# Patient Record
Sex: Female | Born: 1960 | Race: White | Hispanic: No | Marital: Single | Smoking: Former smoker
Health system: Southern US, Community
[De-identification: ages and names within clinical notes are randomized; demographics above are authoritative.]

## PROBLEM LIST (undated history)

## (undated) DIAGNOSIS — M722 Plantar fascial fibromatosis: Secondary | ICD-10-CM

## (undated) DIAGNOSIS — A64 Unspecified sexually transmitted disease: Secondary | ICD-10-CM

## (undated) DIAGNOSIS — I341 Nonrheumatic mitral (valve) prolapse: Secondary | ICD-10-CM

## (undated) HISTORY — DX: Nonrheumatic mitral (valve) prolapse: I34.1

## (undated) HISTORY — DX: Plantar fascial fibromatosis: M72.2

## (undated) HISTORY — DX: Unspecified sexually transmitted disease: A64

## (undated) HISTORY — PX: TONSILLECTOMY: SHX5217

---

## 1999-02-28 ENCOUNTER — Other Ambulatory Visit: Admission: RE | Admit: 1999-02-28 | Discharge: 1999-02-28 | Payer: Self-pay | Admitting: *Deleted

## 2002-07-07 HISTORY — PX: BREAST ENHANCEMENT SURGERY: SHX7

## 2002-11-09 ENCOUNTER — Other Ambulatory Visit: Admission: RE | Admit: 2002-11-09 | Discharge: 2002-11-09 | Payer: Self-pay | Admitting: Obstetrics and Gynecology

## 2004-03-06 ENCOUNTER — Other Ambulatory Visit: Admission: RE | Admit: 2004-03-06 | Discharge: 2004-03-06 | Payer: Self-pay | Admitting: Obstetrics and Gynecology

## 2005-10-13 ENCOUNTER — Other Ambulatory Visit: Admission: RE | Admit: 2005-10-13 | Discharge: 2005-10-13 | Payer: Self-pay | Admitting: Obstetrics and Gynecology

## 2006-04-30 ENCOUNTER — Ambulatory Visit (HOSPITAL_COMMUNITY): Admission: RE | Admit: 2006-04-30 | Discharge: 2006-04-30 | Payer: Self-pay | Admitting: Obstetrics & Gynecology

## 2006-05-05 ENCOUNTER — Other Ambulatory Visit: Admission: RE | Admit: 2006-05-05 | Discharge: 2006-05-05 | Payer: Self-pay | Admitting: Obstetrics & Gynecology

## 2006-10-22 ENCOUNTER — Other Ambulatory Visit: Admission: RE | Admit: 2006-10-22 | Discharge: 2006-10-22 | Payer: Self-pay | Admitting: Obstetrics & Gynecology

## 2007-12-22 ENCOUNTER — Other Ambulatory Visit: Admission: RE | Admit: 2007-12-22 | Discharge: 2007-12-22 | Payer: Self-pay | Admitting: Obstetrics and Gynecology

## 2010-04-06 HISTORY — PX: COLONOSCOPY: SHX174

## 2013-08-06 ENCOUNTER — Encounter: Payer: Self-pay | Admitting: Cardiology

## 2013-09-12 ENCOUNTER — Encounter: Payer: Self-pay | Admitting: Nurse Practitioner

## 2013-09-12 ENCOUNTER — Ambulatory Visit (INDEPENDENT_AMBULATORY_CARE_PROVIDER_SITE_OTHER): Payer: 59 | Admitting: Nurse Practitioner

## 2013-09-12 VITALS — BP 110/64 | HR 68 | Ht 66.0 in | Wt 137.0 lb

## 2013-09-12 DIAGNOSIS — Z9882 Breast implant status: Secondary | ICD-10-CM

## 2013-09-12 DIAGNOSIS — Z978 Presence of other specified devices: Secondary | ICD-10-CM

## 2013-09-12 DIAGNOSIS — Z01419 Encounter for gynecological examination (general) (routine) without abnormal findings: Secondary | ICD-10-CM

## 2013-09-12 DIAGNOSIS — A6004 Herpesviral vulvovaginitis: Secondary | ICD-10-CM | POA: Insufficient documentation

## 2013-09-12 MED ORDER — PENCICLOVIR 1 % EX CREA: 1.0000 "application " | TOPICAL_CREAM | CUTANEOUS | Status: DC

## 2013-09-12 MED ORDER — VALACYCLOVIR HCL 500 MG PO TABS: ORAL_TABLET | ORAL | Status: DC

## 2013-09-12 NOTE — Patient Instructions (Signed)

## 2013-09-12 NOTE — Progress Notes (Signed)
Patient ID: Kim Stone, female   DOB: 02-Jul-1961, 53 y.o.   MRN: [id number] 53 y.o. G1P1001 Single Caucasian Fe here for annual exam.  Not SA for 2 years.  No new health concerns. She went on a cruise with ex husband and daughter.  Have remained friends through the years.  No LMP recorded. Patient is postmenopausal.          Sexually active: no  The current method of family planning is none.    Exercising: yes  Gym/ health club routine includes all exercises daily. Smoker:  no  Health Maintenance: Pap:  08/12/11, WNL, neg HR HPV MMG:  11/23/12, Bi-Rads 1: negative Colonoscopy:  04/2010, normal, repeat in 10 years BMD:   2004 normal TDaP:  2009  Labs: PCP   reports that she quit smoking about 18 years ago. She has never used smokeless tobacco. She reports that she drinks alcohol. She reports that she does not use illicit drugs.  Past Medical History  Diagnosis Date  . MVP (mitral valve prolapse)   . Plantar fasciitis   . 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 Tonsillectomy    . Breast enhancement surgery Bilateral 2004  . Colonoscopy  04/2010    normal recheck in 10 years    Current Outpatient Prescriptions  Medication Sig Dispense Refill  . Calcium 1500 MG tablet Take 1,500 mg by mouth daily.      . metoprolol (LOPRESSOR) 50 MG tablet Take 50 mg by mouth as needed.      . penciclovir (DENAVIR) 1 % cream Apply 1 application topically every 2 (two) hours.  1.5 g  0  . valACYclovir (VALTREX) 500 MG tablet 1 tablet as needed twice daily for flare  30 tablet  12   No current facility-administered medications for this visit.    Family History  Problem Relation Age of Onset  . Heart attack Mother   . Diabetes Mother   . Hyperlipidemia Mother   . Macular degeneration Father   . Heart attack Maternal Grandfather     ROS:  Pertinent items are noted in HPI.  Otherwise, a comprehensive ROS was negative.  Exam:    BP 110/64  Pulse 68  Ht 5\' 6"  (1.676 m)  Wt 137 lb (62.143 kg)  BMI 22.12 kg/m2 Height: 5\' 6"  (167.6 cm)  Ht Readings from Last 3 Encounters:  09/12/13 5\' 6"  (1.676 m)    General appearance: alert, cooperative and appears stated age Head: Normocephalic, without obvious abnormality, atraumatic Neck: no adenopathy, supple, symmetrical, trachea midline and thyroid normal to inspection and palpation Lungs: clear to auscultation bilaterally Breasts: normal appearance, no masses or tenderness Heart: regular rate and rhythm Abdomen: soft, non-tender; no masses,  no organomegaly Extremities: extremities normal, atraumatic, no cyanosis or edema Skin: Skin color, texture, turgor normal. No rashes or lesions Lymph nodes: Cervical, supraclavicular, and axillary nodes normal. No abnormal inguinal nodes palpated Neurologic: Grossly normal   Pelvic: External genitalia:  no lesions              Urethra:  normal appearing urethra with no masses, tenderness or lesions              Bartholin's and Skene's: normal                 Vagina: normal appearing vagina with normal color and discharge, no lesions  Cervix: anteverted              Pap taken: no Bimanual Exam:  Uterus:  normal size, contour, position, consistency, mobility, non-tender              Adnexa: no mass, fullness, tenderness               Rectovaginal: Confirms               Anus:  normal sphincter tone, no lesions  A:  Well Woman with normal exam  Postmenopausal  History of HSV I/ II  P:   Pap smear as per guidelines   Mammogram due 11/2013  Refill on Valtrex for a year and Denavir for oral HSV prn  Counseled on breast self exam, mammography screening, adequate intake of calcium and vitamin D, diet and exercise return annually or prn  An After Visit Summary was printed and given to the patient.

## 2013-09-13 NOTE — Progress Notes (Signed)
Encounter reviewed by Dr. Brook Silva.  

## 2014-05-08 ENCOUNTER — Encounter: Payer: Self-pay | Admitting: Nurse Practitioner

## 2015-01-31 ENCOUNTER — Encounter: Payer: Self-pay | Admitting: Nurse Practitioner

## 2015-12-13 ENCOUNTER — Ambulatory Visit: Payer: Self-pay | Admitting: Interventional Cardiology

## 2016-02-07 ENCOUNTER — Encounter: Payer: Self-pay | Admitting: Nurse Practitioner

## 2016-02-12 ENCOUNTER — Ambulatory Visit: Payer: 59 | Admitting: Interventional Cardiology

## 2016-03-27 ENCOUNTER — Ambulatory Visit: Payer: 59 | Admitting: Interventional Cardiology

## 2016-04-02 ENCOUNTER — Encounter: Payer: Self-pay | Admitting: Interventional Cardiology

## 2016-05-06 ENCOUNTER — Encounter: Payer: Self-pay | Admitting: Interventional Cardiology

## 2016-05-19 ENCOUNTER — Ambulatory Visit (INDEPENDENT_AMBULATORY_CARE_PROVIDER_SITE_OTHER): Payer: 59 | Admitting: Interventional Cardiology

## 2016-05-19 ENCOUNTER — Encounter (INDEPENDENT_AMBULATORY_CARE_PROVIDER_SITE_OTHER): Payer: Self-pay

## 2016-05-19 ENCOUNTER — Encounter: Payer: Self-pay | Admitting: Interventional Cardiology

## 2016-05-19 VITALS — BP 120/84 | HR 73 | Ht 68.0 in | Wt 138.0 lb

## 2016-05-19 DIAGNOSIS — Z8249 Family history of ischemic heart disease and other diseases of the circulatory system: Secondary | ICD-10-CM

## 2016-05-19 DIAGNOSIS — I341 Nonrheumatic mitral (valve) prolapse: Secondary | ICD-10-CM | POA: Diagnosis not present

## 2016-05-19 DIAGNOSIS — R002 Palpitations: Secondary | ICD-10-CM

## 2016-05-19 NOTE — Progress Notes (Signed)
Cardiology Office Note    Date:  05/19/2016   ID:  Kim Stone, DOB 1960/10/10, MRN [id number]  PCP:  Melinda Crutch, MD  Cardiologist: Sinclair Grooms, MD   Chief Complaint  Patient presents with  . Palpitations    History of Present Illness:  Kim Stone is a 55 y.o. female who presents for evaluation of palpitations. Has a history of mitral valve prolapse diagnosed only by auscultation. There is a strong family history of CAD.  Her major concern is that both mother and father died of vascular disease. Patient states that she has a prior short-term smoker. She exercises daily. She has no chronic medical problems. She has a very favorable lipid profile within HDL cholesterol greater than 110. LDL was 83 on no therapy. She has never had syncope. No exertion-related chest discomfort or other cardiovascular complaints.    Past Medical History:  Diagnosis Date  . MVP (mitral valve prolapse)   . Plantar fasciitis   . STD (sexually transmitted disease)    HSV I & II    Past Surgical History:  Procedure Laterality Date  . BREAST ENHANCEMENT SURGERY Bilateral 2004  . COLONOSCOPY  04/2010   normal recheck in 10 years  . TONSILLECTOMY      Current Medications: Outpatient Medications Prior to Visit  Medication Sig Dispense Refill  . Calcium 1500 MG tablet Take 1,500 mg by mouth daily.    . metoprolol (LOPRESSOR) 50 MG tablet Take 50 mg by mouth as needed.    . penciclovir (DENAVIR) 1 % cream Apply 1 application topically every 2 (two) hours. 1.5 g 0  . valACYclovir (VALTREX) 500 MG tablet 1 tablet as needed twice daily for flare 30 tablet 12   No facility-administered medications prior to visit.      Allergies:   Patient has no known allergies.   Social History   Social History  . Marital status: Single    Spouse name: N/A  . Number of children: 1  . Years of education: N/A   Social History Main Topics  . Smoking status: Former Smoker    Packs/day: 1.00    Years:  20.00    Quit date: 09/13/1995  . Smokeless tobacco: Never Used  . Alcohol use Yes     Comment: Occassionally  . Drug use: No  . Sexual activity: No   Other Topics Concern  . None   Social History Narrative  . None     Family History:  The patient's family history includes Diabetes in her mother; Heart attack in her maternal grandfather and mother; Hyperlipidemia in her mother; Macular degeneration in her father.   ROS:   Please see the history of present illness.    None other than anxiety over the possibility of heart disease  All other systems reviewed and are negative.   PHYSICAL EXAM:   VS:  BP 120/84   Pulse 73   Ht 5\' 8"  (1.727 m)   Wt 138 lb (62.6 kg)   SpO2 97%   BMI 20.98 kg/m    GEN: Well nourished, well developed, in no acute distress  HEENT: normal  Neck: no JVD, carotid bruits, or masses Cardiac: RRR; no murmurs, rubs, or gallops,no edema . Late systolic click is heard Respiratory:  clear to auscultation bilaterally, normal work of breathing GI: soft, nontender, nondistended, + BS MS: no deformity or atrophy  Skin: warm and dry, no rash Neuro:  Alert and Oriented x 3, Strength and  sensation are intact Psych: euthymic mood, full affect  Wt Readings from Last 3 Encounters:  05/19/16 138 lb (62.6 kg)  09/12/13 137 lb (62.1 kg)      Studies/Labs Reviewed:   EKG:  EKG  Normal sinus rhythm at 72 bpm. Normal tracing with exception of sinus arrhythmia.  Recent Labs: No results found for requested labs within last 8760 hours.   Lipid Panel No results found for: CHOL, TRIG, HDL, CHOLHDL, VLDL, LDLCALC, LDLDIRECT  Additional studies/ records that were reviewed today include:  Reviewed records from [facility], Dr. Melinda Crutch. She has a prior negative exercise treadmill test in 2013. Exercise the total of 12 minutes.    ASSESSMENT:    1. Mitral valve prolapse   2. Family history of early CAD   3. Palpitations      PLAN:  In order of problems listed  above:  1. Possible auscultatory evidence of mitral valve prolapse. We will go ahead and do an echocardiogram to determine if she indeed has a problem. 2. 2. High sensitivity CRP. If: Faded, we will start a baby aspirin daily. 3. 30 day monitor.    Medication Adjustments/Labs and Tests Ordered: Current medicines are reviewed at length with the patient today.  Concerns regarding medicines are outlined above.  Medication changes, Labs and Tests ordered today are listed in the Patient Instructions below. Patient Instructions  Medication Instructions:  None  Labwork: High sensitivity CRP today  Testing/Procedures: Your physician has requested that you have an echocardiogram. Echocardiography is a painless test that uses sound waves to create images of your heart. It provides your doctor with information about the size and shape of your heart and how well your heart's chambers and valves are working. This procedure takes approximately one hour. There are no restrictions for this procedure.  Your physician has recommended that you wear an event monitor. Event monitors are medical devices that record the heart's electrical activity. Doctors most often [place] these monitors to diagnose arrhythmias. Arrhythmias are problems with the speed or rhythm of the heartbeat. The monitor is a small, portable device. You can wear one while you do your normal daily activities. This is usually used to diagnose what is causing palpitations/syncope (passing out).   Follow-Up: Your physician recommends that you schedule a follow-up appointment as needed with Dr. Tamala Julian.   Any Other Special Instructions Will Be Listed Below (If Applicable).     If you need a refill on your cardiac medications before your next appointment, please call your pharmacy.      Signed, Sinclair Grooms, MD  05/19/2016 10:01 AM    [facility] [place], [place], [place]  [address] Phone: [phone];  Fax: [phone]

## 2016-05-19 NOTE — Patient Instructions (Signed)
Medication Instructions:  None  Labwork: High sensitivity CRP today  Testing/Procedures: Your physician has requested that you have an echocardiogram. Echocardiography is a painless test that uses sound waves to create images of your heart. It provides your doctor with information about the size and shape of your heart and how well your heart's chambers and valves are working. This procedure takes approximately one hour. There are no restrictions for this procedure.  Your physician has recommended that you wear an event monitor. Event monitors are medical devices that record the heart's electrical activity. Doctors most often [place] these monitors to diagnose arrhythmias. Arrhythmias are problems with the speed or rhythm of the heartbeat. The monitor is a small, portable device. You can wear one while you do your normal daily activities. This is usually used to diagnose what is causing palpitations/syncope (passing out).   Follow-Up: Your physician recommends that you schedule a follow-up appointment as needed with Dr. Tamala Julian.   Any Other Special Instructions Will Be Listed Below (If Applicable).     If you need a refill on your cardiac medications before your next appointment, please call your pharmacy.

## 2016-05-20 LAB — HIGH SENSITIVITY CRP: CRP, High Sensitivity: 0.6 mg/L

## 2016-05-22 ENCOUNTER — Telehealth: Payer: Self-pay | Admitting: Interventional Cardiology

## 2016-05-22 ENCOUNTER — Encounter: Payer: Self-pay | Admitting: Interventional Cardiology

## 2016-05-22 NOTE — Telephone Encounter (Signed)
Informed pt of lab results. Pt verbalized understanding. 

## 2016-05-22 NOTE — Telephone Encounter (Signed)
New Message  Pt states she is returning RN call. Please call back to discuss

## 2016-06-16 ENCOUNTER — Other Ambulatory Visit (HOSPITAL_COMMUNITY): Payer: 59

## 2016-06-26 ENCOUNTER — Ambulatory Visit: Payer: Self-pay | Admitting: Nurse Practitioner

## 2016-07-10 ENCOUNTER — Other Ambulatory Visit (HOSPITAL_COMMUNITY): Payer: 59

## 2016-07-31 ENCOUNTER — Encounter: Payer: Self-pay | Admitting: Interventional Cardiology

## 2016-08-11 ENCOUNTER — Encounter: Payer: Self-pay | Admitting: Interventional Cardiology

## 2016-09-16 ENCOUNTER — Telehealth: Payer: Self-pay | Admitting: Interventional Cardiology

## 2016-09-16 DIAGNOSIS — I341 Nonrheumatic mitral (valve) prolapse: Secondary | ICD-10-CM

## 2016-09-16 NOTE — Telephone Encounter (Signed)
New Message     Pt called this morning to schedule echo, it has expired she would like 10/03/16 2p if possible if you can get it ordered today .    Thank you

## 2016-09-16 NOTE — Telephone Encounter (Signed)
Called patient about her message. Patient stated she had to cancel her echo several times due to work. Patient stated she was sorry for cancelling and would like to rescheduled. Rescheduled patient for echo on 10/03/16. Patient verbalized understanding.

## 2016-10-03 ENCOUNTER — Encounter (INDEPENDENT_AMBULATORY_CARE_PROVIDER_SITE_OTHER): Payer: Self-pay

## 2016-10-03 ENCOUNTER — Other Ambulatory Visit: Payer: Self-pay

## 2016-10-03 ENCOUNTER — Ambulatory Visit (HOSPITAL_COMMUNITY): Payer: 59 | Attending: Cardiology

## 2016-10-03 DIAGNOSIS — I341 Nonrheumatic mitral (valve) prolapse: Secondary | ICD-10-CM

## 2016-10-03 DIAGNOSIS — I501 Left ventricular failure: Secondary | ICD-10-CM | POA: Diagnosis not present

## 2016-11-18 ENCOUNTER — Encounter: Payer: Self-pay | Admitting: Nurse Practitioner

## 2016-11-18 ENCOUNTER — Ambulatory Visit (INDEPENDENT_AMBULATORY_CARE_PROVIDER_SITE_OTHER): Payer: 59 | Admitting: Nurse Practitioner

## 2016-11-18 VITALS — BP 124/70 | HR 60 | Ht 66.0 in | Wt 136.0 lb

## 2016-11-18 DIAGNOSIS — Z0000 Encounter for general adult medical examination without abnormal findings: Secondary | ICD-10-CM | POA: Diagnosis not present

## 2016-11-18 DIAGNOSIS — Z1211 Encounter for screening for malignant neoplasm of colon: Secondary | ICD-10-CM

## 2016-11-18 DIAGNOSIS — Z01419 Encounter for gynecological examination (general) (routine) without abnormal findings: Secondary | ICD-10-CM

## 2016-11-18 DIAGNOSIS — Z124 Encounter for screening for malignant neoplasm of cervix: Secondary | ICD-10-CM | POA: Diagnosis not present

## 2016-11-18 LAB — COMPREHENSIVE METABOLIC PANEL
ALBUMIN: 4.9 g/dL (ref 3.6–5.1)
ALK PHOS: 69 U/L (ref 33–130)
ALT: 16 U/L (ref 6–29)
AST: 26 U/L (ref 10–35)
BILIRUBIN TOTAL: 0.7 mg/dL (ref 0.2–1.2)
BUN: 19 mg/dL (ref 7–25)
CALCIUM: 9.9 mg/dL (ref 8.6–10.4)
CO2: 27 mmol/L (ref 20–31)
CREATININE: 0.82 mg/dL (ref 0.50–1.05)
Chloride: 101 mmol/L (ref 98–110)
Glucose, Bld: 105 mg/dL — ABNORMAL HIGH (ref 65–99)
Potassium: 4.1 mmol/L (ref 3.5–5.3)
SODIUM: 141 mmol/L (ref 135–146)
Total Protein: 7.7 g/dL (ref 6.1–8.1)

## 2016-11-18 LAB — CBC
HCT: 41.2 % (ref 35.0–45.0)
Hemoglobin: 13.8 g/dL (ref 11.7–15.5)
MCH: 32.5 pg (ref 27.0–33.0)
MCHC: 33.5 g/dL (ref 32.0–36.0)
MCV: 97.2 fL (ref 80.0–100.0)
MPV: 11.6 fL (ref 7.5–12.5)
PLATELETS: 213 10*3/uL (ref 140–400)
RBC: 4.24 MIL/uL (ref 3.80–5.10)
RDW: 13.3 % (ref 11.0–15.0)
WBC: 4.5 10*3/uL (ref 3.8–10.8)

## 2016-11-18 LAB — LIPID PANEL
CHOLESTEROL: 240 mg/dL — AB (ref <200)
HDL: 173 mg/dL (ref >50)
LDL Cholesterol: 57 mg/dL (ref <100)
Total CHOL/HDL Ratio: 1.4 Ratio (ref <5.0)
Triglycerides: 49 mg/dL (ref <150)
VLDL: 10 mg/dL (ref <30)

## 2016-11-18 LAB — TSH: TSH: 2.48 m[IU]/L

## 2016-11-18 NOTE — Patient Instructions (Addendum)

## 2016-11-18 NOTE — Progress Notes (Signed)
56 y.o. G1P1001 Single  Caucasian Fe here for annual exam.  Lapse of GYN care > 3 yrs.  She is having no new health problems.  Not dating or SA.  She travels a lot with her job at [employer] in addition to working from home.  10/03/16 Echo done showing moderate MV prolapse and mild leak.    Patient's last menstrual period was 11/18/2008 (approximate).          Sexually active: No.  The current method of family planning is post menopausal status.    Exercising: Yes.    patient works out daily, cardio, weights, running 5 miles Smoker:  no  Health Maintenance: Pap: 08/12/11, Negative with neg HR HPV History of Abnormal Pap: No MMG: 01/28/16, 3D-yes, Density Category B, Bi-Rads 1:  Negative Self Breast exams: yes Colonoscopy: 04/2010, Normal, repeat in 10 years BMD: unsure date, but is sure she has had this done at some point TDaP: 07/08/07 Shingles: Not indicated due to age Pneumonia: Not indicated due to age Hep C and HIV:  discuss today, has previously had HIV screen, unsure when Labs: PCP takes care of all labs   reports that she quit smoking about 21 years ago. She has a 20.00 pack-year smoking history. She has never used smokeless tobacco. She reports that she drinks alcohol. She reports that she does not use drugs.  Past Medical History:  Diagnosis Date  . MVP (mitral valve prolapse)   . Plantar fasciitis   . STD (sexually transmitted disease)    HSV I & II    Past Surgical History:  Procedure Laterality Date  . BREAST ENHANCEMENT SURGERY Bilateral 2004  . COLONOSCOPY  04/2010   normal recheck in 10 years  . TONSILLECTOMY      Current Outpatient Prescriptions  Medication Sig Dispense Refill  . metoprolol (LOPRESSOR) 50 MG tablet Take 50 mg by mouth as needed.     No current facility-administered medications for this visit.     Family History  Problem Relation Age of Onset  . Heart attack Mother   . Diabetes Mother   . Hyperlipidemia Mother   . Macular degeneration  Father   . Prostate cancer Father   . Heart attack Maternal Grandfather     ROS:  Pertinent items are noted in HPI.  Otherwise, a comprehensive ROS was negative.  Exam:   BP 124/70 (BP Location: Right Arm, Patient Position: Sitting, Cuff Size: Normal)   Pulse 60   Ht 5\' 6"  (1.676 m)   Wt 136 lb (61.7 kg)   LMP 11/18/2008 (Approximate)   BMI 21.95 kg/m  Height: 5\' 6"  (167.6 cm) Ht Readings from Last 3 Encounters:  11/18/16 5\' 6"  (1.676 m)  05/19/16 5\' 8"  (1.727 m)  09/12/13 5\' 6"  (1.676 m)    General appearance: alert, cooperative and appears stated age Head: Normocephalic, without obvious abnormality, atraumatic Neck: no adenopathy, supple, symmetrical, trachea midline and thyroid normal to inspection and palpation Lungs: clear to auscultation bilaterally Breasts: normal appearance, no masses or tenderness, positive findings: implants bilaterally Heart: regular rate and rhythm Abdomen: soft, non-tender; no masses,  no organomegaly Extremities: extremities normal, atraumatic, no cyanosis or edema Skin: Skin color, texture, turgor normal. No rashes or lesions Lymph nodes: Cervical, supraclavicular, and axillary nodes normal. No abnormal inguinal nodes palpated Neurologic: Grossly normal   Pelvic: External genitalia:  no lesions              Urethra:  normal appearing urethra with no masses,  tenderness or lesions              Bartholin's and Skene's: normal                 Vagina: normal appearing vagina with normal color and discharge, no lesions              Cervix: anteverted              Pap taken: Yes.   Bimanual Exam:  Uterus:  normal size, contour, position, consistency, mobility, non-tender              Adnexa: no mass, fullness, tenderness               Rectovaginal: Confirms               Anus:  normal sphincter tone, no lesions  Chaperone present: yes  A:  Well Woman with normal exam  Postmenopausal  History of MVP -  Last ECHO 10/03/16             History of  HSV I/ II  S/P Breast Augmentation   P:   Reviewed health and wellness pertinent to exam  Pap smear: yes  Mammogram is due 01/2017  Follow with labs  IFOB [place] given  Counseled on breast self exam, mammography screening, adequate intake of calcium and vitamin D, diet and exercise return annually or prn  An After Visit Summary was printed and given to the patient.

## 2016-11-19 LAB — HEPATITIS C ANTIBODY: HCV AB: NEGATIVE

## 2016-11-19 LAB — VITAMIN D 25 HYDROXY (VIT D DEFICIENCY, FRACTURES): VIT D 25 HYDROXY: 25 ng/mL — AB (ref 30–100)

## 2016-11-19 NOTE — Progress Notes (Signed)
Reviewed personally.  M. Suzanne Kyrie Bun, MD.  

## 2016-11-20 LAB — IPS PAP TEST WITH HPV

## 2016-11-21 LAB — FECAL OCCULT BLOOD, IMMUNOCHEMICAL: IFOBT: NEGATIVE

## 2016-11-21 NOTE — Addendum Note (Signed)
Addended by: Terence Lux A on: 11/21/2016 03:51 PM   Modules accepted: Orders

## 2016-11-26 ENCOUNTER — Telehealth: Payer: Self-pay | Admitting: *Deleted

## 2016-11-26 NOTE — Telephone Encounter (Signed)
I have attempted to contact this patient by phone with the following results: left message to return call to [place] at [phone] on answering machine (mobile per [facility]). Advised stool test was negative and to return call for other labs. [phone] (Mobile) *Preferred*

## 2016-11-26 NOTE — Telephone Encounter (Signed)
-----   Message from Kem Boroughs, [place] sent at 11/21/2016  4:42 PM EDT ----- Please let pt know that IFOB is negative.

## 2016-12-04 MED ORDER — VITAMIN D (ERGOCALCIFEROL) 1.25 MG (50000 UNIT) PO CAPS: 50000.0000 [IU] | ORAL_CAPSULE | ORAL | 1 refills | Status: DC

## 2016-12-04 NOTE — Telephone Encounter (Signed)
Call to patient, advised I was calling to review lab results.  She is in the process of getting into a cab and will call back in a few minutes.

## 2016-12-04 NOTE — Telephone Encounter (Signed)
Return call to [place].

## 2016-12-04 NOTE — Telephone Encounter (Deleted)
-----   Message from Kem Boroughs, [place] sent at 11/21/2016  4:42 PM EDT ----- Please let pt know that IFOB is negative.

## 2016-12-04 NOTE — Telephone Encounter (Signed)
Patient returning your call.

## 2016-12-04 NOTE — Addendum Note (Signed)
Addended by: Graylon Good on: 12/04/2016 03:52 PM   Modules accepted: Orders

## 2016-12-04 NOTE — Telephone Encounter (Signed)
Pt notified in result note.  Closing encounter. 

## 2017-02-23 DIAGNOSIS — Z1231 Encounter for screening mammogram for malignant neoplasm of breast: Secondary | ICD-10-CM | POA: Diagnosis not present

## 2017-03-26 ENCOUNTER — Other Ambulatory Visit: Payer: Self-pay | Admitting: *Deleted

## 2017-03-26 NOTE — Telephone Encounter (Signed)
Left msg on patients cell phone per her request from our conversation earlier today to let her know to contact her pcp regarding metoprolol refill.

## 2017-03-26 NOTE — Telephone Encounter (Signed)
Patient called and requested a refill on metoprolol from Dr Tamala Julian. I do not see where he has ever filled this for the patient. I have made her aware that since she is PRN follow up with Dr Tamala Julian, he may refill it once, but further refills will need to be authorized by PCP. She verbalized her understanding. Okay to refill? If okay should it be written for qd prn? Per patient, it has always been written for just prn, but the pharmacy does not usually accept it without a dose frequency. Please advise. Thanks, MI

## 2017-03-26 NOTE — Telephone Encounter (Signed)
Really needs to come from PCP or whomever prescribed this since we didn't prescribe and she is PRN f/u.  Thanks!

## 2017-05-19 DIAGNOSIS — Z23 Encounter for immunization: Secondary | ICD-10-CM | POA: Diagnosis not present

## 2018-02-04 DIAGNOSIS — S93491A Sprain of other ligament of right ankle, initial encounter: Secondary | ICD-10-CM | POA: Diagnosis not present

## 2018-03-03 DIAGNOSIS — Z1231 Encounter for screening mammogram for malignant neoplasm of breast: Secondary | ICD-10-CM | POA: Diagnosis not present

## 2018-03-10 ENCOUNTER — Encounter: Payer: Self-pay | Admitting: Obstetrics & Gynecology

## 2018-05-28 ENCOUNTER — Encounter: Payer: Self-pay | Admitting: Certified Nurse Midwife

## 2018-05-28 ENCOUNTER — Ambulatory Visit (INDEPENDENT_AMBULATORY_CARE_PROVIDER_SITE_OTHER): Payer: 59 | Admitting: Certified Nurse Midwife

## 2018-05-28 ENCOUNTER — Other Ambulatory Visit: Payer: Self-pay

## 2018-05-28 VITALS — BP 116/76 | HR 64 | Resp 16 | Ht 66.25 in | Wt 136.0 lb

## 2018-05-28 DIAGNOSIS — N951 Menopausal and female climacteric states: Secondary | ICD-10-CM

## 2018-05-28 DIAGNOSIS — Z01419 Encounter for gynecological examination (general) (routine) without abnormal findings: Secondary | ICD-10-CM

## 2018-05-28 DIAGNOSIS — B009 Herpesviral infection, unspecified: Secondary | ICD-10-CM

## 2018-05-28 MED ORDER — VALACYCLOVIR HCL 1 G PO TABS: ORAL_TABLET | ORAL | 4 refills | Status: DC

## 2018-05-28 NOTE — Patient Instructions (Signed)

## 2018-05-28 NOTE — Progress Notes (Signed)
57 y.o. G1P1001 Single  Caucasian Fe here for annual exam. Menopausal no HRT. No hot flashes or night sweats. Denies vaginal dryness or vaginal bleeding. Oral history of HSV with Valtrex working well. Needs Rx update. Sees Dr Harrington Challenger for aex and labs Patient's last menstrual period was 11/18/2008 (approximate).          Sexually active: No.  The current method of family planning is post menopausal status.    Exercising: Yes.     Smoker:  no  Review of Systems  Constitutional: Negative.   HENT: Negative.   Eyes: Negative.   Respiratory: Negative.   Cardiovascular: Negative.   Gastrointestinal: Negative.   Genitourinary: Negative.   Musculoskeletal: Negative.   Skin: Negative.   Neurological: Negative.   Endo/Heme/Allergies: Negative.   Psychiatric/Behavioral: Negative.     Health Maintenance: Pap:  08-12-11 neg HPV HR neg, 11-18-16 neg HPV HR neg History of Abnormal Pap: no MMG:  03-03-18 category b density birads 2:neg Self Breast exams: yes Colonoscopy:  2011 normal f/u 17yrs BMD:  Unsure of date TDaP:  2016 Shingles: no Pneumonia: no Hep C and HIV: hep c neg 2018 Labs: no   reports that she quit smoking about 22 years ago. She has a 20.00 pack-year smoking history. She has never used smokeless tobacco. She reports that she drinks alcohol. She reports that she does not use drugs.  Past Medical History:  Diagnosis Date  . MVP (mitral valve prolapse)   . Plantar fasciitis   . STD (sexually transmitted disease)    HSV I & II    Past Surgical History:  Procedure Laterality Date  . BREAST ENHANCEMENT SURGERY Bilateral 2004  . COLONOSCOPY  04/2010   normal recheck in 10 years  . TONSILLECTOMY      Current Outpatient Medications  Medication Sig Dispense Refill  . metoprolol (LOPRESSOR) 50 MG tablet Take 50 mg by mouth as needed.     No current facility-administered medications for this visit.     Family History  Problem Relation Age of Onset  . Heart attack Mother    . Diabetes Mother   . Hyperlipidemia Mother   . Macular degeneration Father   . Prostate cancer Father   . Heart attack Maternal Grandfather     ROS:  Pertinent items are noted in HPI.  Otherwise, a comprehensive ROS was negative.  Exam:   BP 116/76   Pulse 64   Resp 16   Ht 5' 6.25" (1.683 m)   Wt 136 lb (61.7 kg)   LMP 11/18/2008 (Approximate)   BMI 21.79 kg/m  Height: 5' 6.25" (168.3 cm) Ht Readings from Last 3 Encounters:  05/28/18 5' 6.25" (1.683 m)  11/18/16 5\' 6"  (1.676 m)  05/19/16 5\' 8"  (1.727 m)    General appearance: alert, cooperative and appears stated age Head: Normocephalic, without obvious abnormality, atraumatic Neck: no adenopathy, supple, symmetrical, trachea midline and thyroid normal to inspection and palpation Lungs: clear to auscultation bilaterally Breasts: normal appearance, no masses or tenderness, No nipple retraction or dimpling, No nipple discharge or bleeding, No axillary or supraclavicular adenopathy Heart: regular rate and rhythm Abdomen: soft, non-tender; no masses,  no organomegaly Extremities: extremities normal, atraumatic, no cyanosis or edema Skin: Skin color, texture, turgor normal. No rashes or lesions Lymph nodes: Cervical, supraclavicular, and axillary nodes normal. No abnormal inguinal nodes palpated Neurologic: Grossly normal   Pelvic: External genitalia:  no lesions  Urethra:  normal appearing urethra with no masses, tenderness or lesions              Bartholin's and Skene's: normal                 Vagina: normal appearing vagina with normal color and discharge, no lesions              Cervix: no cervical motion tenderness and no lesions              Pap taken: No. Bimanual Exam:  Uterus:  normal size, contour, position, consistency, mobility, non-tender              Adnexa: normal adnexa and no mass, fullness, tenderness               Rectovaginal: Confirms               Anus:  normal sphincter tone, no  lesions  Chaperone present: yes  A:  Well Woman with normal exam  Menopausal no HRT  History of HSV 1 Valtrex works well needs Rx update  MD management of MVP, labs and aex     P:   Reviewed health and wellness pertinent to exam  Aware of need to advise if vaginal bleeding or dryness issues  Rx Valtrex see order with instructions  Continue follow up with MD as indicated  Pap smear: no   counseled on breast self exam, mammography screening, feminine hygiene, adequate intake of calcium and vitamin D, diet and exercise  return annually or prn  An After Visit Summary was printed and given to the patient.

## 2018-06-18 DIAGNOSIS — E559 Vitamin D deficiency, unspecified: Secondary | ICD-10-CM | POA: Diagnosis not present

## 2018-06-18 DIAGNOSIS — I341 Nonrheumatic mitral (valve) prolapse: Secondary | ICD-10-CM | POA: Diagnosis not present

## 2018-06-18 DIAGNOSIS — Z23 Encounter for immunization: Secondary | ICD-10-CM | POA: Diagnosis not present

## 2018-06-18 DIAGNOSIS — Z0000 Encounter for general adult medical examination without abnormal findings: Secondary | ICD-10-CM | POA: Diagnosis not present

## 2018-06-24 DIAGNOSIS — H2513 Age-related nuclear cataract, bilateral: Secondary | ICD-10-CM | POA: Diagnosis not present

## 2018-06-24 DIAGNOSIS — H538 Other visual disturbances: Secondary | ICD-10-CM | POA: Diagnosis not present

## 2018-08-03 DIAGNOSIS — L821 Other seborrheic keratosis: Secondary | ICD-10-CM | POA: Diagnosis not present

## 2018-08-03 DIAGNOSIS — L814 Other melanin hyperpigmentation: Secondary | ICD-10-CM | POA: Diagnosis not present

## 2018-08-03 DIAGNOSIS — D225 Melanocytic nevi of trunk: Secondary | ICD-10-CM | POA: Diagnosis not present

## 2019-01-11 ENCOUNTER — Telehealth: Payer: Self-pay

## 2019-01-11 NOTE — Telephone Encounter (Signed)
Called pt to offer her a sooner appt with Dr Tamala Julian. She is scheduled in Aug and we have some July openings.  No answer.Marland KitchenMarland KitchenLM

## 2019-02-16 NOTE — Progress Notes (Signed)
nO SHOW

## 2019-02-18 ENCOUNTER — Ambulatory Visit (INDEPENDENT_AMBULATORY_CARE_PROVIDER_SITE_OTHER): Payer: 59 | Admitting: Interventional Cardiology

## 2019-02-18 DIAGNOSIS — Z7189 Other specified counseling: Secondary | ICD-10-CM

## 2019-02-18 DIAGNOSIS — Z8249 Family history of ischemic heart disease and other diseases of the circulatory system: Secondary | ICD-10-CM

## 2019-02-18 DIAGNOSIS — R002 Palpitations: Secondary | ICD-10-CM

## 2019-02-18 DIAGNOSIS — I341 Nonrheumatic mitral (valve) prolapse: Secondary | ICD-10-CM

## 2019-02-19 ENCOUNTER — Encounter: Payer: Self-pay | Admitting: Interventional Cardiology

## 2019-08-20 ENCOUNTER — Ambulatory Visit: Payer: 59 | Attending: Internal Medicine

## 2019-08-20 DIAGNOSIS — Z23 Encounter for immunization: Secondary | ICD-10-CM | POA: Insufficient documentation

## 2019-08-20 NOTE — Progress Notes (Signed)
   Covid-19 Vaccination Clinic  Name:  Kim Stone    MRN: [id number] DOB: 05-24-1961  08/20/2019  [name] was observed post Covid-19 immunization for 15 minutes without incidence. She was provided with Vaccine Information Sheet and instruction to access the V-Safe system.   [name] was instructed to call 911 with any severe reactions post vaccine: Marland Kitchen Difficulty breathing  . Swelling of your face and throat  . A fast heartbeat  . A bad rash all over your body  . Dizziness and weakness    Immunizations Administered    Name Date Dose VIS Date Route   Pfizer COVID-19 Vaccine 08/20/2019  2:20 PM 0.3 mL 06/17/2019 Intramuscular   Manufacturer: [place]   Lot: [id number]   [place]: [id number]

## 2019-09-12 ENCOUNTER — Ambulatory Visit: Payer: 59 | Attending: Internal Medicine

## 2019-09-12 DIAGNOSIS — Z23 Encounter for immunization: Secondary | ICD-10-CM | POA: Insufficient documentation

## 2019-09-12 NOTE — Progress Notes (Signed)
   Covid-19 Vaccination Clinic  Name:  Kim Stone    MRN: [id number] DOB: [birth year]  09/12/2019  [name] was observed post Covid-19 immunization for 15 minutes without incident. She was provided with Vaccine Information Sheet and instruction to access the V-Safe system.   [name] was instructed to call 911 with any severe reactions post vaccine: Marland Kitchen Difficulty breathing  . Swelling of face and throat  . A fast heartbeat  . A bad rash all over body  . Dizziness and weakness   Immunizations Administered    Name Date Dose VIS Date Route   Pfizer COVID-19 Vaccine 09/12/2019 12:27 PM 0.3 mL 06/17/2019 Intramuscular   Manufacturer: [place]   Lot: [id number]   [place]: [id number]

## 2019-09-26 ENCOUNTER — Encounter: Payer: Self-pay | Admitting: Certified Nurse Midwife

## 2020-03-08 ENCOUNTER — Encounter: Payer: Self-pay | Admitting: Obstetrics and Gynecology

## 2020-03-08 ENCOUNTER — Ambulatory Visit (INDEPENDENT_AMBULATORY_CARE_PROVIDER_SITE_OTHER): Payer: 59 | Admitting: Obstetrics and Gynecology

## 2020-03-08 ENCOUNTER — Other Ambulatory Visit: Payer: Self-pay

## 2020-03-08 DIAGNOSIS — B009 Herpesviral infection, unspecified: Secondary | ICD-10-CM | POA: Diagnosis not present

## 2020-03-08 MED ORDER — VALACYCLOVIR HCL 1 G PO TABS: ORAL_TABLET | ORAL | 1 refills | Status: DC

## 2020-03-08 NOTE — Patient Instructions (Signed)

## 2020-03-08 NOTE — Progress Notes (Signed)
59 y.o. G18P1001 Single White or Caucasian Not Hispanic or Latino female here for annual exam.   No vaginal bleeding. No bowel or bladder c/o.  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      Patient's last menstrual period was 11/18/2008 (approximate).          Sexually active: No.  The current method of family planning is post menopausal status.    Exercising: Yes.    weights run, row, bike, cross fit. Smoker:  no  Health Maintenance: Pap: 11/18/16 Neg HPV Neg 08/12/11 Neg Hpv Neg  History of abnormal Pap:  no MMG:  Report requested BMD:   Unsure of date  Colonoscopy: 2011 due this year  TDaP:  2016  Gardasil: NA   reports that she quit smoking about 24 years ago. She has a 20.00 pack-year smoking history. She has never used smokeless tobacco. She reports current alcohol use. She reports that she does not use drugs. Son is a Scientist, clinical (histocompatibility and immunogenetics), moving to [place]. Works in Programmer, applications.   Past Medical History:  Diagnosis Date  . MVP (mitral valve prolapse)   . Plantar fasciitis   . STD (sexually transmitted disease)    HSV I & II    Past Surgical History:  Procedure Laterality Date  . BREAST ENHANCEMENT SURGERY Bilateral 2004  . COLONOSCOPY  04/2010   normal recheck in 10 years  . TONSILLECTOMY      Current Outpatient Medications  Medication Sig Dispense Refill  . metoprolol (LOPRESSOR) 50 MG tablet Take 50 mg by mouth as needed.    . valACYclovir (VALTREX) 1000 MG tablet Take 2 tablets twice daily for one day for oral HSV 3 tablet 4   No current facility-administered medications for this visit.    Family History  Problem Relation Age of Onset  . Heart attack Mother   . Diabetes Mother   . Hyperlipidemia Mother   . Macular degeneration Father   . Prostate cancer Father   . Heart attack Maternal Grandfather     Review of Systems  All other systems reviewed and are negative.   Exam:   BP 104/68   Pulse 71   Ht 5' 6.5" (1.689 m)   Wt 133 lb (60.3 kg)   LMP 11/18/2008  (Approximate)   SpO2 99%   BMI 21.15 kg/m   Weight change: @WEIGHTCHANGE @ Height:   Height: 5' 6.5" (168.9 cm)  Ht Readings from Last 3 Encounters:  03/08/20 5' 6.5" (1.689 m)  05/28/18 5' 6.25" (1.683 m)  11/18/16 5\' 6"  (1.676 m)    General appearance: alert, cooperative and appears stated age Head: Normocephalic, without obvious abnormality, atraumatic Neck: no adenopathy, supple, symmetrical, trachea midline and thyroid normal to inspection and palpation Lungs: clear to auscultation bilaterally Cardiovascular: regular rate and rhythm Breasts: normal appearance, no masses or tenderness, bilateral implants Abdomen: soft, non-tender; non distended,  no masses,  no organomegaly Extremities: extremities normal, atraumatic, no cyanosis or edema Skin: Skin color, texture, turgor normal. No rashes or lesions Lymph nodes: Cervical, supraclavicular, and axillary nodes normal. No abnormal inguinal nodes palpated Neurologic: Grossly normal   Pelvic: External genitalia:  no lesions              Urethra:  normal appearing urethra with no masses, tenderness or lesions              Bartholins and Skenes: normal                 Vagina: atrophic  appearing vagina with normal color and discharge, no lesions              Cervix: no lesions               Bimanual Exam:  Uterus:  normal size, contour, position, consistency, mobility, non-tender              Adnexa: no mass, fullness, tenderness               Rectovaginal: Confirms               Anus:  normal sphincter tone, no lesions  Shanon Petty chaperoned for the exam.  A:  Well Woman with normal exam  P:   No pap this year  Mammogram scheduled   Colonoscopy scheduled  Labs with primary

## 2020-05-23 ENCOUNTER — Other Ambulatory Visit: Payer: Self-pay

## 2020-05-23 DIAGNOSIS — B009 Herpesviral infection, unspecified: Secondary | ICD-10-CM

## 2020-05-23 MED ORDER — VALACYCLOVIR HCL 1 G PO TABS: ORAL_TABLET | ORAL | 1 refills | Status: AC

## 2020-05-23 NOTE — Telephone Encounter (Signed)
I called in 30 tablets with one refill 2 months ago. If she is having frequent outbreaks she could consider suppression, please call and discuss this with her. I have sent the refill.

## 2020-05-23 NOTE — Telephone Encounter (Signed)
Call to patient. Message given to patient as seen below from Dr. Talbert Nan. Patient states she gets fever blisters about once every 18 months and takes Valtrex as needed with fever blister onset. States she never had previous prescription filled. Got a fever blister last week and only had 3 pills left from old prescription, so wanted new prescription to have on hand.   Routing to provider and will close encounter.

## 2020-05-23 NOTE — Telephone Encounter (Signed)
Patient is calling in regards to refill of Valtrex.

## 2020-05-23 NOTE — Telephone Encounter (Signed)
Medication refill request: Valtrex Last OV:  03/08/20 Dr. Talbert Nan Next AEX: none Last MMG (if hormonal medication request): n/a Refill authorized: today, please advise

## 2020-06-02 ENCOUNTER — Other Ambulatory Visit: Payer: Self-pay | Admitting: Obstetrics and Gynecology

## 2020-06-02 DIAGNOSIS — B009 Herpesviral infection, unspecified: Secondary | ICD-10-CM

## 2020-09-24 NOTE — Progress Notes (Deleted)
Cardiology Office Note:   Date:  09/24/2020  NAME:  Kim Stone    MRN: [id number] DOB:  08/13/1960   PCP:  Lawerance Cruel, MD  Cardiologist:  No primary care provider on file.  Electrophysiologist:  None   Referring MD: Lawerance Cruel, MD   No chief complaint on file. ***  History of Present Illness:   Kim Stone is a 60 y.o. female with a hx of HLD, MVP who is being seen today for the evaluation of mitral valve prolapse at the request of Lawerance Cruel, MD.  Problem List 1. HLD -T chol 240, HDL 173, LDL 57, TG 49 2. Mitral valve prolapse   Past Medical History: Past Medical History:  Diagnosis Date  . MVP (mitral valve prolapse)   . Plantar fasciitis   . STD (sexually transmitted disease)    HSV I & II    Past Surgical History: Past Surgical History:  Procedure Laterality Date  . BREAST ENHANCEMENT SURGERY Bilateral 2004  . COLONOSCOPY  04/2010   normal recheck in 10 years  . TONSILLECTOMY      Current Medications: No outpatient medications have been marked as taking for the 09/26/20 encounter (Appointment) with O'Neal, Cassie Freer, MD.     Allergies:    Patient has no known allergies.   Social History: Social History   Socioeconomic History  . Marital status: Single    Spouse name: Not on file  . Number of children: 1  . Years of education: Not on file  . Highest education level: Not on file  Occupational History  . Not on file  Tobacco Use  . Smoking status: Former Smoker    Packs/day: 1.00    Years: 20.00    Pack years: 20.00    Quit date: 09/13/1995    Years since quitting: 25.0  . Smokeless tobacco: Never Used  Vaping Use  . Vaping Use: Never used  Substance and Sexual Activity  . Alcohol use: Yes    Comment: a couple a week  . Drug use: No  . Sexual activity: Not Currently    Partners: Male    Birth control/protection: Post-menopausal  Other Topics Concern  . Not on file  Social History Narrative  . Not on file    Social Determinants of Health   Financial Resource Strain: Not on file  Food Insecurity: Not on file  Transportation Needs: Not on file  Physical Activity: Not on file  Stress: Not on file  Social Connections: Not on file     Family History: The patient's ***family history includes Diabetes in her mother; Heart attack in her maternal grandfather and mother; Hyperlipidemia in her mother; Macular degeneration in her father; Prostate cancer in her father.  ROS:   All other ROS reviewed and negative. Pertinent positives noted in the HPI.     EKGs/Labs/Other Studies Reviewed:   The following studies were personally reviewed by me today:  EKG:  EKG is *** ordered today.  The ekg ordered today demonstrates ***, and was personally reviewed by me.   Recent Labs: No results found for requested labs within last 8760 hours.   Recent Lipid Panel    Component Value Date/Time   CHOL 240 (H) 11/18/2016 1554   TRIG 49 11/18/2016 1554   HDL 173 11/18/2016 1554   CHOLHDL 1.4 11/18/2016 1554   VLDL 10 11/18/2016 1554   LDLCALC 57 11/18/2016 1554    Physical Exam:   VS:  LMP 11/18/2008 (  Approximate)    Wt Readings from Last 3 Encounters:  03/08/20 133 lb (60.3 kg)  05/28/18 136 lb (61.7 kg)  11/18/16 136 lb (61.7 kg)    General: Well nourished, well developed, in no acute distress Head: Atraumatic, normal size  Eyes: PEERLA, EOMI  Neck: Supple, no JVD Endocrine: No thryomegaly Cardiac: Normal S1, S2; RRR; no murmurs, rubs, or gallops Lungs: Clear to auscultation bilaterally, no wheezing, rhonchi or rales  Abd: Soft, nontender, no hepatomegaly  Ext: No edema, pulses 2+ Musculoskeletal: No deformities, BUE and BLE strength normal and equal Skin: Warm and dry, no rashes   Neuro: Alert and oriented to person, place, time, and situation, CNII-XII grossly intact, no focal deficits  Psych: Normal mood and affect   ASSESSMENT:   Kim Stone is a 59 y.o. female who presents for the  following: No diagnosis found.  PLAN:   There are no diagnoses linked to this encounter.  Disposition: No follow-ups on file.  Medication Adjustments/Labs and Tests Ordered: Current medicines are reviewed at length with the patient today.  Concerns regarding medicines are outlined above.  No orders of the defined types were placed in this encounter.  No orders of the defined types were placed in this encounter.   There are no Patient Instructions on file for this visit.   Time Spent with Patient: I have spent a total of *** minutes with patient reviewing hospital notes, telemetry, EKGs, labs and examining the patient as well as establishing an assessment and plan that was discussed with the patient.  > 50% of time was spent in direct patient care.  Signed, Addison Naegeli. Audie Box, MD, [facility]  [address], [place], [place] [address] [phone]  09/24/2020 8:20 PM

## 2020-09-26 ENCOUNTER — Ambulatory Visit: Payer: 59 | Admitting: Cardiovascular Disease

## 2020-09-26 DIAGNOSIS — E782 Mixed hyperlipidemia: Secondary | ICD-10-CM

## 2020-09-26 DIAGNOSIS — I341 Nonrheumatic mitral (valve) prolapse: Secondary | ICD-10-CM

## 2021-01-20 NOTE — Progress Notes (Deleted)
Cardiology Office Note:    Date:  01/20/2021   ID:  Kim Stone, DOB 05/22/1961, MRN [id number]  PCP:  Lawerance Cruel, MD  Cardiologist:  None  Electrophysiologist:  None   Referring MD: Lawerance Cruel, MD   No chief complaint on file. ***  History of Present Illness:    Kim Stone is a 60 y.o. female with a hx of mitral valve prolapse who presents for an initial visit.  Previously followed with Dr. Tamala Julian, last seen in November 2017.  Echocardiogram 10/03/2016 showed moderate posterior leaflet prolapse, trivial MR, EF 55 to 41%, grade 1 diastolic dysfunction.  Past Medical History:  Diagnosis Date   MVP (mitral valve prolapse)    Plantar fasciitis    STD (sexually transmitted disease)    HSV I & II    Past Surgical History:  Procedure Laterality Date   BREAST ENHANCEMENT SURGERY Bilateral 2004   COLONOSCOPY  04/2010   normal recheck in 10 years   TONSILLECTOMY      Current Medications: No outpatient medications have been marked as taking for the 01/24/21 encounter (Appointment) with Donato Heinz, MD.     Allergies:   Patient has no known allergies.   Social History   Socioeconomic History   Marital status: Single    Spouse name: Not on file   Number of children: 1   Years of education: Not on file   Highest education level: Not on file  Occupational History   Not on file  Tobacco Use   Smoking status: Former    Packs/day: 1.00    Years: 20.00    Pack years: 20.00    Types: Cigarettes    Quit date: 09/13/1995    Years since quitting: 25.3   Smokeless tobacco: Never  Vaping Use   Vaping Use: Never used  Substance and Sexual Activity   Alcohol use: Yes    Comment: a couple a week   Drug use: No   Sexual activity: Not Currently    Partners: Male    Birth control/protection: Post-menopausal  Other Topics Concern   Not on file  Social History Narrative   Not on file   Social Determinants of Health   Financial Resource Strain: Not  on file  Food Insecurity: Not on file  Transportation Needs: Not on file  Physical Activity: Not on file  Stress: Not on file  Social Connections: Not on file     Family History: The patient's ***family history includes Diabetes in her mother; Heart attack in her maternal grandfather and mother; Hyperlipidemia in her mother; Macular degeneration in her father; Prostate cancer in her father.  ROS:   Please see the history of present illness.    *** All other systems reviewed and are negative.  EKGs/Labs/Other Studies Reviewed:    The following studies were reviewed today: ***  EKG:  EKG is *** ordered today.  The ekg ordered today demonstrates ***  Recent Labs: No results found for requested labs within last 8760 hours.  Recent Lipid Panel    Component Value Date/Time   CHOL 240 (H) 11/18/2016 1554   TRIG 49 11/18/2016 1554   HDL 173 11/18/2016 1554   CHOLHDL 1.4 11/18/2016 1554   VLDL 10 11/18/2016 1554   LDLCALC 57 11/18/2016 1554    Physical Exam:    VS:  LMP 11/18/2008 (Approximate)     Wt Readings from Last 3 Encounters:  03/08/20 133 lb (60.3 kg)  05/28/18 136 lb (  61.7 kg)  11/18/16 136 lb (61.7 kg)     GEN: *** Well nourished, well developed in no acute distress HEENT: Normal NECK: No JVD; No carotid bruits LYMPHATICS: No lymphadenopathy CARDIAC: ***RRR, no murmurs, rubs, gallops RESPIRATORY:  Clear to auscultation without rales, wheezing or rhonchi  ABDOMEN: Soft, non-tender, non-distended MUSCULOSKELETAL:  No edema; No deformity  SKIN: Warm and dry NEUROLOGIC:  Alert and oriented x 3 PSYCHIATRIC:  Normal affect   ASSESSMENT:    No diagnosis found. PLAN:    Mitral valve prolapse: Echocardiogram 10/03/2016 showed moderate posterior leaflet prolapse, trivial MR.  RTC in ***    Medication Adjustments/Labs and Tests Ordered: Current medicines are reviewed at length with the patient today.  Concerns regarding medicines are outlined above.  No  orders of the defined types were placed in this encounter.  No orders of the defined types were placed in this encounter.   There are no Patient Instructions on file for this visit.   Signed, Donato Heinz, MD  01/20/2021 8:27 PM    [place]

## 2021-01-23 NOTE — Progress Notes (Signed)
Cardiology Office Note:    Date:  01/24/2021   ID:  KENZIE THORESON, DOB July 03, 1961, MRN [id number]  PCP:  Lawerance Cruel, MD  Cardiologist:  None  Electrophysiologist:  None   Referring MD: Lawerance Cruel, MD   Chief Complaint  Patient presents with   Mitral Valve Prolapse    History of Present Illness:    Kim Stone is a 60 y.o. female with a hx of mitral valve prolapse who is referred by Dr Harrington Challenger for evaluation of MVP.  Previously followed with Dr. Tamala Julian, last seen in November 2017.  Echocardiogram 10/03/2016 showed moderate posterior leaflet prolapse, trivial MR, EF 55 to 92%, grade 1 diastolic dysfunction.  For today's visit, she has lightheadedness occasionally and rare palpitations that comes due to stress.  Particularly notices it when doing public speaking, will take metoprolol as needed for this. Denies chest pains, shortness of breath or LE edema. She exercises everyday for about an 1-1/2 hours with no complications. She runs, go to the gym, and has a stationary bike and rower at home. She says her  mother had a heart disease with no further details and grandfather died of MI. Stopped smoking 30 years ago.   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   valACYclovir (VALTREX) 1000 MG tablet Take 2 tablets twice daily for one day for oral HSV (Patient taking differently: Take 2 tablets twice daily for one day for oral HSV As Needed)     Allergies:   Patient has no known allergies.   Social History   Socioeconomic History   Marital status: Single    Spouse name: Not on file    Number of children: 1   Years of education: Not on file   Highest education level: Not on file  Occupational History   Not on file  Tobacco Use   Smoking status: Former    Packs/day: 1.00    Years: 20.00    Pack years: 20.00    Types: Cigarettes    Quit date: 09/13/1995    Years since quitting: 25.3   Smokeless tobacco: Never  Vaping Use   Vaping Use: Never used  Substance and Sexual Activity   Alcohol use: Yes    Comment: a couple a week   Drug use: No   Sexual activity: Not Currently    Partners: Male    Birth control/protection: Post-menopausal  Other Topics Concern   Not on file  Social History Narrative   Not on file   Social Determinants of Health   Financial Resource Strain: Not on file  Food Insecurity: Not on file  Transportation Needs: Not on file  Physical Activity: Not on file  Stress: Not on file  Social Connections: Not on file     Family History: The patient's family history includes Diabetes in her mother; Heart attack in her maternal grandfather and mother; Hyperlipidemia in her mother; Macular degeneration in her father; Prostate cancer in her father.  ROS:   Please see the history  of present illness.    (+) lightheadedness (+) palpitations  All other systems reviewed and are negative.  EKGs/Labs/Other Studies Reviewed:    The following studies were reviewed today:  Echo 09/2016:  Study Conclusions   - Left ventricle: The cavity size was normal. Wall thickness was    increased in a pattern of mild LVH. Systolic function was normal.    The estimated ejection fraction was in the range of 55% to 60%.    Wall motion was normal; there were no regional wall motion    abnormalities. Doppler parameters are consistent with abnormal    left ventricular relaxation (grade 1 diastolic dysfunction).  - Mitral valve: Moderate prolapse, involving the posterior leaflet.   EKG:   07/22: sinus rhythm, sinus arrhythmia, rate 73, no st  abnormalities Recent Labs: No results found for requested labs within last 8760 hours.  Recent Lipid Panel    Component Value Date/Time   CHOL 240 (H) 11/18/2016 1554   TRIG 49 11/18/2016 1554   HDL 173 11/18/2016 1554   CHOLHDL 1.4 11/18/2016 1554   VLDL 10 11/18/2016 1554   LDLCALC 57 11/18/2016 1554    Physical Exam:    VS:  BP 140/70 (BP Location: Right Arm)   Pulse 73   Ht 5\' 7"  (1.702 m)   Wt 136 lb 9.6 oz (62 kg)   LMP 11/18/2008 (Approximate)   SpO2 97%   BMI 21.39 kg/m     Wt Readings from Last 3 Encounters:  01/24/21 136 lb 9.6 oz (62 kg)  03/08/20 133 lb (60.3 kg)  05/28/18 136 lb (61.7 kg)     GEN:  Well nourished, well developed in no acute distress HEENT: Normal NECK: No JVD; No carotid bruits LYMPHATICS: No lymphadenopathy CARDIAC: RRR, no murmurs, rubs, gallops RESPIRATORY:  Clear to auscultation without rales, wheezing or rhonchi  ABDOMEN: Soft, non-tender, non-distended MUSCULOSKELETAL:  No edema; No deformity  SKIN: Warm and dry NEUROLOGIC:  Alert and oriented x 3 PSYCHIATRIC:  Normal affect   ASSESSMENT:    1. Mitral valve prolapse   2. Hyperlipidemia, unspecified hyperlipidemia type    PLAN:    Mitral valve prolapse: Echocardiogram 10/03/2016 showed moderate posterior leaflet prolapse, trivial MR.  Will repeat echo  Hyperlipidemia: LDL 97 on 12/18/2020.  10 year ASCVD risk score 3%, does not meet indication for statin. Does have family history of CAD, will check calcium score   Elevated BP: 140/70 on initial check today, was 110/70 when I rechecked.  Will monitor  RTC in 1 year   Medication Adjustments/Labs and Tests Ordered: Current medicines are reviewed at length with the patient today.  Concerns regarding medicines are outlined above.  Orders Placed This Encounter  Procedures   CT CARDIAC SCORING (SELF PAY ONLY)   EKG 12-Lead   ECHOCARDIOGRAM COMPLETE   No orders of the defined types were placed in this encounter.   Patient  Instructions  Medication Instructions:  Your physician recommends that you continue on your current medications as directed. Please refer to the Current Medication list given to you today.  *If you need a refill on your cardiac medications before your next appointment, please call your pharmacy*  Testing/Procedures: Your physician has requested that you have an echocardiogram. Echocardiography is a painless test that uses sound waves to create images of your heart. It provides your doctor with information about the size and shape of your heart and how well your heart's chambers and valves are working. This procedure takes approximately  one hour. There are no restrictions for this procedure. This will be done at our [facility] location:  [address]  CT coronary calcium score. This test is done at [address]. This is $99 out of pocket.   Coronary CalciumScan A coronary calcium scan is an imaging test used to look for deposits of calcium and other fatty materials (plaques) in the inner lining of the blood vessels of the heart (coronary arteries). These deposits of calcium and plaques can partly clog and narrow the coronary arteries without producing any symptoms or warning signs. This puts a person at risk for a heart attack. This test can detect these deposits before symptoms develop. Tell a health care provider about: Any allergies you have. All medicines you are taking, including vitamins, herbs, eye drops, creams, and over-the-counter medicines. Any problems you or family members have had with anesthetic medicines. Any blood disorders you have. Any surgeries you have had. Any medical conditions you have. Whether you are pregnant or may be pregnant. What are the risks? Generally, this is a safe procedure. However, problems may occur, including: Harm to a pregnant woman and her unborn baby. This test involves the use of radiation. Radiation exposure  can be dangerous to a pregnant woman and her unborn baby. If you are pregnant, you generally should not have this procedure done. Slight increase in the risk of cancer. This is because of the radiation involved in the test. What happens before the procedure? No preparation is needed for this procedure. What happens during the procedure? You will undress and remove any jewelry around your neck or chest. You will put on a hospital gown. Sticky electrodes will be placed on your chest. The electrodes will be connected to an electrocardiogram (ECG) machine to record a tracing of the electrical activity of your heart. A CT scanner will take pictures of your heart. During this time, you will be asked to lie still and hold your breath for 2-3 seconds while a picture of your heart is being taken. The procedure may vary among health care providers and hospitals. What happens after the procedure? You can get dressed. You can return to your normal activities. It is up to you to get the results of your test. Ask your health care provider, or the department that is doing the test, when your results will be ready. Summary A coronary calcium scan is an imaging test used to look for deposits of calcium and other fatty materials (plaques) in the inner lining of the blood vessels of the heart (coronary arteries). Generally, this is a safe procedure. Tell your health care provider if you are pregnant or may be pregnant. No preparation is needed for this procedure. A CT scanner will take pictures of your heart. You can return to your normal activities after the scan is done. This information is not intended to replace advice given to you by your health care provider. Make sure you discuss any questions you have with your health care provider. Document Released: 12/20/2007 Document Revised: 05/12/2016 Document Reviewed: 05/12/2016 Elsevier Interactive Patient Education  2017 [place]: At [facility], you and your health needs are our priority.  As part of our continuing mission to provide you with exceptional heart care, we have created designated Provider Care Teams.  These Care Teams include your primary Cardiologist (physician) and Advanced Practice Providers (APPs -  Physician Assistants and Nurse Practitioners) who all work together  to provide you with the care you need, when you need it.  We recommend signing up for the patient portal called "MyChart".  Sign up information is provided on this After Visit Summary.  MyChart is used to connect with patients for Virtual Visits (Telemedicine).  Patients are able to view lab/test results, encounter notes, upcoming appointments, etc.  Non-urgent messages can be sent to your provider as well.   To learn more about what you can do with MyChart, go to [email or web address].    Your next appointment:   12 month(s)  The format for your next appointment:   In Person  Provider:   Oswaldo Milian, MD       Ardell Isaacs as a scribe for Donato Heinz, MD.,have documented all relevant documentation on the behalf of Donato Heinz, MD,as directed by  Donato Heinz, MD while in the presence of Donato Heinz, MD.  I, Donato Heinz, MD, have reviewed all documentation for this visit. The documentation on 01/24/21 for the exam, diagnosis, procedures, and orders are all accurate and complete.   Signed, Donato Heinz, MD  01/24/2021 9:30 AM    [facility]

## 2021-01-24 ENCOUNTER — Encounter: Payer: Self-pay | Admitting: Cardiology

## 2021-01-24 ENCOUNTER — Ambulatory Visit (INDEPENDENT_AMBULATORY_CARE_PROVIDER_SITE_OTHER): Payer: 59 | Admitting: Cardiology

## 2021-01-24 ENCOUNTER — Other Ambulatory Visit: Payer: Self-pay

## 2021-01-24 VITALS — BP 140/70 | HR 73 | Ht 67.0 in | Wt 136.6 lb

## 2021-01-24 DIAGNOSIS — I341 Nonrheumatic mitral (valve) prolapse: Secondary | ICD-10-CM | POA: Diagnosis not present

## 2021-01-24 DIAGNOSIS — E785 Hyperlipidemia, unspecified: Secondary | ICD-10-CM

## 2021-01-24 NOTE — Patient Instructions (Signed)
Medication Instructions:  Your physician recommends that you continue on your current medications as directed. Please refer to the Current Medication list given to you today.  *If you need a refill on your cardiac medications before your next appointment, please call your pharmacy*  Testing/Procedures: Your physician has requested that you have an echocardiogram. Echocardiography is a painless test that uses sound waves to create images of your heart. It provides your doctor with information about the size and shape of your heart and how well your heart's chambers and valves are working. This procedure takes approximately one hour. There are no restrictions for this procedure. This will be done at our [facility] location:  [address]  CT coronary calcium score. This test is done at [address]. This is $99 out of pocket.   Coronary CalciumScan A coronary calcium scan is an imaging test used to look for deposits of calcium and other fatty materials (plaques) in the inner lining of the blood vessels of the heart (coronary arteries). These deposits of calcium and plaques can partly clog and narrow the coronary arteries without producing any symptoms or warning signs. This puts a person at risk for a heart attack. This test can detect these deposits before symptoms develop. Tell a health care provider about: Any allergies you have. All medicines you are taking, including vitamins, herbs, eye drops, creams, and over-the-counter medicines. Any problems you or family members have had with anesthetic medicines. Any blood disorders you have. Any surgeries you have had. Any medical conditions you have. Whether you are pregnant or may be pregnant. What are the risks? Generally, this is a safe procedure. However, problems may occur, including: Harm to a pregnant woman and her unborn baby. This test involves the use of radiation. Radiation exposure can be  dangerous to a pregnant woman and her unborn baby. If you are pregnant, you generally should not have this procedure done. Slight increase in the risk of cancer. This is because of the radiation involved in the test. What happens before the procedure? No preparation is needed for this procedure. What happens during the procedure? You will undress and remove any jewelry around your neck or chest. You will put on a hospital gown. Sticky electrodes will be placed on your chest. The electrodes will be connected to an electrocardiogram (ECG) machine to record a tracing of the electrical activity of your heart. A CT scanner will take pictures of your heart. During this time, you will be asked to lie still and hold your breath for 2-3 seconds while a picture of your heart is being taken. The procedure may vary among health care providers and hospitals. What happens after the procedure? You can get dressed. You can return to your normal activities. It is up to you to get the results of your test. Ask your health care provider, or the department that is doing the test, when your results will be ready. Summary A coronary calcium scan is an imaging test used to look for deposits of calcium and other fatty materials (plaques) in the inner lining of the blood vessels of the heart (coronary arteries). Generally, this is a safe procedure. Tell your health care provider if you are pregnant or may be pregnant. No preparation is needed for this procedure. A CT scanner will take pictures of your heart. You can return to your normal activities after the scan is done. This information is not intended to replace advice  given to you by your health care provider. Make sure you discuss any questions you have with your health care provider. Document Released: 12/20/2007 Document Revised: 05/12/2016 Document Reviewed: 05/12/2016 Elsevier Interactive Patient Education  2017 [place]: At [facility],  you and your health needs are our priority.  As part of our continuing mission to provide you with exceptional heart care, we have created designated Provider Care Teams.  These Care Teams include your primary Cardiologist (physician) and Advanced Practice Providers (APPs -  Physician Assistants and Nurse Practitioners) who all work together to provide you with the care you need, when you need it.  We recommend signing up for the patient portal called "MyChart".  Sign up information is provided on this After Visit Summary.  MyChart is used to connect with patients for Virtual Visits (Telemedicine).  Patients are able to view lab/test results, encounter notes, upcoming appointments, etc.  Non-urgent messages can be sent to your provider as well.   To learn more about what you can do with MyChart, go to [email or web address].    Your next appointment:   12 month(s)  The format for your next appointment:   In Person  Provider:   Oswaldo Milian, MD

## 2021-02-11 ENCOUNTER — Ambulatory Visit (HOSPITAL_COMMUNITY): Payer: 59 | Attending: Cardiology

## 2021-02-11 ENCOUNTER — Ambulatory Visit (INDEPENDENT_AMBULATORY_CARE_PROVIDER_SITE_OTHER)
Admission: RE | Admit: 2021-02-11 | Discharge: 2021-02-11 | Disposition: A | Discharge status: Home / Self Care | Payer: Self-pay | Source: Ambulatory Visit | Attending: Cardiology | Admitting: Cardiology

## 2021-02-11 ENCOUNTER — Other Ambulatory Visit: Payer: Self-pay

## 2021-02-11 DIAGNOSIS — I341 Nonrheumatic mitral (valve) prolapse: Secondary | ICD-10-CM

## 2021-02-11 LAB — ECHOCARDIOGRAM COMPLETE
Area-P 1/2: 4.29 cm2
S' Lateral: 2.6 cm

## 2021-02-12 ENCOUNTER — Other Ambulatory Visit: Payer: Self-pay | Admitting: *Deleted

## 2021-02-12 MED ORDER — ATORVASTATIN CALCIUM 20 MG PO TABS: 20.0000 mg | ORAL_TABLET | Freq: Every day | ORAL | 3 refills | Status: DC

## 2021-02-22 ENCOUNTER — Telehealth: Payer: Self-pay | Admitting: Cardiology

## 2021-02-22 MED ORDER — ATORVASTATIN CALCIUM 20 MG PO TABS: 20.0000 mg | ORAL_TABLET | Freq: Every day | ORAL | 3 refills | Status: DC

## 2021-02-22 NOTE — Telephone Encounter (Signed)
Returned call to patient, patient requesting to have Lipitor (Atorvastatin) sent to her pharmacy at [facility] in [place] as her vacation home is there. Advised patient that her prescription has been sent to her preferred pharmacy, advised her to call us with any issues in getting filled in [place] in the future. Patient verbalized understanding.

## 2021-02-22 NOTE — Telephone Encounter (Signed)
Patient is returning call to discuss CT results. 

## 2021-05-20 ENCOUNTER — Other Ambulatory Visit: Payer: Self-pay | Admitting: *Deleted

## 2021-05-20 DIAGNOSIS — E785 Hyperlipidemia, unspecified: Secondary | ICD-10-CM

## 2021-10-22 ENCOUNTER — Other Ambulatory Visit: Payer: Self-pay | Admitting: Family Medicine

## 2021-10-22 ENCOUNTER — Ambulatory Visit
Admission: RE | Admit: 2021-10-22 | Discharge: 2021-10-22 | Disposition: A | Discharge status: Home / Self Care | Payer: 59 | Source: Ambulatory Visit | Attending: Family Medicine | Admitting: Family Medicine

## 2021-10-22 DIAGNOSIS — R222 Localized swelling, mass and lump, trunk: Secondary | ICD-10-CM

## 2021-11-04 ENCOUNTER — Ambulatory Visit (INDEPENDENT_AMBULATORY_CARE_PROVIDER_SITE_OTHER): Payer: 59 | Admitting: Cardiology

## 2021-11-04 ENCOUNTER — Encounter: Payer: Self-pay | Admitting: Cardiology

## 2021-11-04 ENCOUNTER — Telehealth: Payer: Self-pay | Admitting: Cardiology

## 2021-11-04 ENCOUNTER — Ambulatory Visit (INDEPENDENT_AMBULATORY_CARE_PROVIDER_SITE_OTHER): Payer: 59

## 2021-11-04 VITALS — BP 130/78 | HR 65 | Ht 67.0 in | Wt 141.4 lb

## 2021-11-04 DIAGNOSIS — I2510 Atherosclerotic heart disease of native coronary artery without angina pectoris: Secondary | ICD-10-CM

## 2021-11-04 DIAGNOSIS — E785 Hyperlipidemia, unspecified: Secondary | ICD-10-CM

## 2021-11-04 DIAGNOSIS — I493 Ventricular premature depolarization: Secondary | ICD-10-CM | POA: Diagnosis not present

## 2021-11-04 DIAGNOSIS — I341 Nonrheumatic mitral (valve) prolapse: Secondary | ICD-10-CM | POA: Diagnosis not present

## 2021-11-04 DIAGNOSIS — Z01818 Encounter for other preprocedural examination: Secondary | ICD-10-CM

## 2021-11-04 NOTE — Patient Instructions (Signed)
Medication Instructions:  ?Your physician recommends that you continue on your current medications as directed. Please refer to the Current Medication list given to you today. ? ?*If you need a refill on your cardiac medications before your next appointment, please call your pharmacy* ? ? ?Lab Work: ?BMET, [place] today ? ?If you have labs (blood work) drawn today and your tests are completely normal, you will receive your results only by: ?MyChart Message (if you have MyChart) OR ?A paper copy in the mail ?If you have any lab test that is abnormal or we need to change your treatment, we will call you to review the results. ? ? ?Testing/Procedures: ?ZIO XT- Long Term Monitor Instructions  ? ?Your physician has requested you wear a ZIO patch monitor for _3_ days.  ?This is a single patch monitor.   [employer] supplies one patch monitor per enrollment. Additional stickers are not available. Please do not apply patch if you will be having a Nuclear Stress Test, Echocardiogram, Cardiac CT, MRI, or Chest Xray during the period you would be wearing the monitor. The patch cannot be worn during these tests. You cannot remove and re-apply the ZIO XT patch monitor.  ?Your ZIO patch monitor will be sent Fed Ex from [employer] directly to your home address. It may take 3-5 days to receive your monitor after you have been enrolled.  ?Once you have received your monitor, please review the enclosed instructions. Your monitor has already been registered assigning a specific monitor serial # to you. ? ?Billing and Patient Assistance Program Information  ? ?We have supplied [employer] with any of your insurance information on file for billing purposes. ?[employer] offers a sliding scale Patient Assistance Program for patients that do not have insurance, or whose insurance does not completely cover the cost of the ZIO monitor.   You must apply for the Patient Assistance Program to qualify for this discounted rate.     To apply, please  call [employer] at [phone], select option 4, then select option 2, and ask to apply for Patient Assistance Program.  Theodore Demark will ask your household income, and how many people are in your household.  They will quote your out-of-pocket cost based on that information.  [employer] will also be able to set up a 69-month interest-free payment plan if needed. ? ?Applying the monitor  ? ?Shave hair from upper left chest.  ?Hold abrader disc by orange tab. Rub abrader in 40 strokes over the upper left chest as indicated in your monitor instructions.  ?Clean area with 4 enclosed alcohol pads. Let dry.  ?Apply patch as indicated in monitor instructions. Patch will be placed under collarbone on left side of chest with arrow pointing upward.  ?Rub patch adhesive wings for 2 minutes. Remove white label marked "1". Remove the white label marked "2". Rub patch adhesive wings for 2 additional minutes.  ?While looking in a mirror, press and release button in center of patch. A small green light will flash 3-4 times. This will be your only indicator that the monitor has been turned on. ?  ?Do not shower for the first 24 hours. You may shower after the first 24 hours.  ?Press the button if you feel a symptom. You will hear a small click. Record Date, Time and Symptom in the Patient Logbook.  ?When you are ready to remove the patch, follow instructions on the last 2 pages of the Patient Logbook. Stick patch monitor onto the last page of Patient Logbook.  ?  Place Patient Logbook in the blue and white box.  Use locking tab on box and tape box closed securely.  The blue and white box has prepaid postage on it. Please place it in the mailbox as soon as possible. Your physician should have your test results approximately 7 days after the monitor has been mailed back to [facility].  ?Call [facility] at [phone] if you have questions regarding your ZIO XT patch monitor. Call them immediately if you see an orange  light blinking on your monitor.  ?If your monitor falls off in less than 4 days, contact our Monitor department at [phone]. ?If your monitor becomes loose or falls off after 4 days call [employer] at [phone] for suggestions on securing your monitor.? ? ? ? ?Follow-Up: ?At [facility], you and your health needs are our priority.  As part of our continuing mission to provide you with exceptional heart care, we have created designated Provider Care Teams.  These Care Teams include your primary Cardiologist (physician) and Advanced Practice Providers (APPs -  Physician Assistants and Nurse Practitioners) who all work together to provide you with the care you need, when you need it. ? ?We recommend signing up for the patient portal called "MyChart".  Sign up information is provided on this After Visit Summary.  MyChart is used to connect with patients for Virtual Visits (Telemedicine).  Patients are able to view lab/test results, encounter notes, upcoming appointments, etc.  Non-urgent messages can be sent to your provider as well.   ?To learn more about what you can do with MyChart, go to [email or web address].   ? ?Your next appointment:   ?6 month(s) ? ?The format for your next appointment:   ?In Person ? ?Provider:   ?Dr. Gardiner Rhyme ? ?Important Information About Sugar ? ? ? ? ? ? ?

## 2021-11-04 NOTE — Progress Notes (Unsigned)
Enrolled for [employer] to mail a ZIO XT long term holter monitor to the patients address on file.  

## 2021-11-04 NOTE — Telephone Encounter (Signed)
Dr. Paulita Fujita spoke to DOD, Dr. Gardiner Rhyme. ? ?Patient scheduled today at 3:30 pm with Dr. Gardiner Rhyme.  ?

## 2021-11-04 NOTE — Progress Notes (Signed)
?Cardiology Office Note:   ? ?Date:  11/04/2021  ? ?ID:  Kim Stone, DOB 30-Jun-1961, MRN [id number] ? ?PCP:  Lawerance Cruel, MD  ?Cardiologist:  None  ?Electrophysiologist:  None  ? ?Referring MD: Lawerance Cruel, MD  ? ?Chief Complaint  ?Patient presents with  ? Irregular Heart Beat  ? ? ?History of Present Illness:   ? ?Kim Stone is a 61 y.o. female with a hx of mitral valve prolapse who presents for follow-up.  She was referred by Dr Harrington Challenger for evaluation of MVP, initially seen on 01/24/2021.  Previously followed with Dr. Tamala Julian, last seen in November 2017.  Echocardiogram 10/03/2016 showed moderate posterior leaflet prolapse, trivial MR, EF 55 to 48%, grade 1 diastolic dysfunction. ? ?Echocardiogram 02/11/2021 showed normal biventricular function, normal diastolic function, mild mitral valve posterior leaflet prolapse with trivial MR.  Calcium score 42 on 02/11/21 (80th percentile). ? ?She presented for colonoscopy today and was found to have frequent PVCs, colonoscopy was canceled.  She denies any palpitations.  Reports she has been doing well.  Denies any chest pain, dyspnea, lightheadedness, syncope, lower extremity edema, or palpitations.  She is very active, works out for 60 to 90 minutes daily.  Denies any exertional symptoms. ? ? ?Past Medical History:  ?Diagnosis Date  ? MVP (mitral valve prolapse)   ? Plantar fasciitis   ? STD (sexually transmitted disease)   ? HSV I & II  ? ? ?Past Surgical History:  ?Procedure Laterality Date  ? BREAST ENHANCEMENT SURGERY Bilateral 2004  ? COLONOSCOPY  04/2010  ? normal recheck in 10 years  ? TONSILLECTOMY    ? ? ?Current Medications: ?Current Meds  ?Medication Sig  ? atorvastatin (LIPITOR) 20 MG tablet Take 1 tablet (20 mg total) by mouth daily.  ? cyanocobalamin (,VITAMIN B-12,) 1000 MCG/ML injection 80m  ? metoprolol (LOPRESSOR) 50 MG tablet Take 50 mg by mouth as needed.  ? valACYclovir (VALTREX) 1000 MG tablet Take 2 tablets twice daily for one day for oral HSV  (Patient taking differently: Take 2 tablets twice daily for one day for oral HSV As Needed)  ?  ? ?Allergies:   Patient has no known allergies.  ? ?Social History  ? ?Socioeconomic History  ? Marital status: Single  ?  Spouse name: Not on file  ? Number of children: 1  ? Years of education: Not on file  ? Highest education level: Not on file  ?Occupational History  ? Not on file  ?Tobacco Use  ? Smoking status: Former  ?  Packs/day: 1.00  ?  Years: 20.00  ?  Pack years: 20.00  ?  Types: Cigarettes  ?  Quit date: 09/13/1995  ?  Years since quitting: 26.1  ? Smokeless tobacco: Never  ?Vaping Use  ? Vaping Use: Never used  ?Substance and Sexual Activity  ? Alcohol use: Yes  ?  Comment: a couple a week  ? Drug use: No  ? Sexual activity: Not Currently  ?  Partners: Male  ?  Birth control/protection: Post-menopausal  ?Other Topics Concern  ? Not on file  ?Social History Narrative  ? Not on file  ? ?Social Determinants of Health  ? ?Financial Resource Strain: Not on file  ?Food Insecurity: Not on file  ?Transportation Needs: Not on file  ?Physical Activity: Not on file  ?Stress: Not on file  ?Social Connections: Not on file  ?  ? ?Family History: ?The patient's family history includes Diabetes in her  mother; Heart attack in her maternal grandfather and mother; Hyperlipidemia in her mother; Macular degeneration in her father; Prostate cancer in her father. ? ?ROS:   ?Please see the history of present illness.    ? ? All other systems reviewed and are negative. ? ?EKGs/Labs/Other Studies Reviewed:   ? ?The following studies were reviewed today: ? ?Echo 09/2016: ? ?Study Conclusions  ? ?- Left ventricle: The cavity size was normal. Wall thickness was  ?  increased in a pattern of mild LVH. Systolic function was normal.  ?  The estimated ejection fraction was in the range of 55% to 60%.  ?  Wall motion was normal; there were no regional wall motion  ?  abnormalities. Doppler parameters are consistent with abnormal  ?  left  ventricular relaxation (grade 1 diastolic dysfunction).  ?- Mitral valve: Moderate prolapse, involving the posterior leaflet.  ? ?EKG:   ?11/04/2021: Sinus rhythm, rate 57, no ST abnormalities, no PVCs ?07/22: sinus rhythm, sinus arrhythmia, rate 73, no st abnormalities ? ?Recent Labs: ?No results found for requested labs within last 8760 hours.  ?Recent Lipid Panel ?   ?Component Value Date/Time  ? CHOL 240 (H) 11/18/2016 1554  ? TRIG 49 11/18/2016 1554  ? HDL 173 11/18/2016 1554  ? CHOLHDL 1.4 11/18/2016 1554  ? VLDL 10 11/18/2016 1554  ? [place] 57 11/18/2016 1554  ? ? ?Physical Exam:   ? ?VS:  BP 130/78   Pulse 65   Ht '5\' 7"'$  (1.702 m)   Wt 141 lb 6.4 oz (64.1 kg)   LMP 11/18/2008 (Approximate)   SpO2 97%   BMI 22.15 kg/m?    ? ?Wt Readings from Last 3 Encounters:  ?11/04/21 141 lb 6.4 oz (64.1 kg)  ?01/24/21 136 lb 9.6 oz (62 kg)  ?03/08/20 133 lb (60.3 kg)  ?  ? ?GEN:  Well nourished, well developed in no acute distress ?HEENT: Normal ?NECK: No JVD; No carotid bruits ?CARDIAC: RRR, no murmurs, rubs, gallops ?RESPIRATORY:  Clear to auscultation without rales, wheezing or rhonchi  ?ABDOMEN: Soft, non-tender, non-distended ?MUSCULOSKELETAL:  No edema; No deformity  ?SKIN: Warm and dry ?NEUROLOGIC:  Alert and oriented x 3 ?PSYCHIATRIC:  Normal affect  ? ?ASSESSMENT:   ? ?1. Frequent PVCs   ?2. Pre-op evaluation   ?3. Coronary artery disease involving native coronary artery of native heart without angina pectoris   ?4. Mitral valve prolapse   ?5. Hyperlipidemia, unspecified hyperlipidemia type   ? ? ?PLAN:   ? ?Frequent PVCs: Noted on rhythm strip at presentation for colonoscopy today.  Suspect may be due to to electrolyte abnormality from her prep.  EKG in clinic this afternoon showed no PVCs.  Will check BMET, magnesium.  Normal TSH 07/2021.  Check Zio patch x3 days to quantify PVC burden.  Echocardiogram 02/11/2021 showed normal biventricular function, normal diastolic function, mild mitral valve posterior  leaflet prolapse with trivial MR.   ? ?Preop evaluation: Prior to colonoscopy.  Colonoscopy was canceled today due to frequent PVCs.  She is very active, works out for 60 to 90 minutes daily without exertional symptoms.  Planning further evaluation for PVCs as above, if unremarkable no further work-up recommended. ? ?CAD: Calcium score 42 on 02/11/21 (80th percentile). ?-Continue atorvastatin 20 mg daily.  LDL 61 on 08/05/2021 ? ?Mitral valve prolapse: Echocardiogram 10/03/2016 showed moderate posterior leaflet prolapse, trivial MR.  Echocardiogram 02/11/2021 showed normal biventricular function, normal diastolic function, mild mitral valve posterior leaflet prolapse with trivial MR.   ? ?  Hyperlipidemia: Continue atorvastatin 20 mg daily.  LDL 61 on 08/05/2021 ? ? ?RTC in 6 months ? ? ?Medication Adjustments/Labs and Tests Ordered: ?Current medicines are reviewed at length with the patient today.  Concerns regarding medicines are outlined above.  ?Orders Placed This Encounter  ?Procedures  ? Basic metabolic panel  ? Magnesium  ? LONG TERM MONITOR (3-14 DAYS)  ? EKG 12-Lead  ? ?No orders of the defined types were placed in this encounter. ? ? ? ?Patient Instructions  ?Medication Instructions:  ?Your physician recommends that you continue on your current medications as directed. Please refer to the Current Medication list given to you today. ? ?*If you need a refill on your cardiac medications before your next appointment, please call your pharmacy* ? ? ?Lab Work: ?BMET, [place] today ? ?If you have labs (blood work) drawn today and your tests are completely normal, you will receive your results only by: ?MyChart Message (if you have MyChart) OR ?A paper copy in the mail ?If you have any lab test that is abnormal or we need to change your treatment, we will call you to review the results. ? ? ?Testing/Procedures: ?ZIO XT- Long Term Monitor Instructions  ? ?Your physician has requested you wear a ZIO patch monitor for _3_ days.   ?This is a single patch monitor.   [employer] supplies one patch monitor per enrollment. Additional stickers are not available. Please do not apply patch if you will be having a Nuclear Stress Test, Echocardiogram, Card

## 2021-11-05 LAB — BASIC METABOLIC PANEL
BUN/Creatinine Ratio: 11 — ABNORMAL LOW (ref 12–28)
BUN: 11 mg/dL (ref 8–27)
CO2: 23 mmol/L (ref 20–29)
Calcium: 9.8 mg/dL (ref 8.7–10.3)
Chloride: 106 mmol/L (ref 96–106)
Creatinine, Ser: 0.96 mg/dL (ref 0.57–1.00)
Glucose: 111 mg/dL — ABNORMAL HIGH (ref 70–99)
Potassium: 4.3 mmol/L (ref 3.5–5.2)
Sodium: 144 mmol/L (ref 134–144)
eGFR: 67 mL/min/{1.73_m2} (ref >59)

## 2021-11-05 LAB — MAGNESIUM: Magnesium: 2.1 mg/dL (ref 1.6–2.3)

## 2021-11-07 DIAGNOSIS — I493 Ventricular premature depolarization: Secondary | ICD-10-CM | POA: Diagnosis not present

## 2021-11-20 ENCOUNTER — Other Ambulatory Visit: Payer: Self-pay | Admitting: Otolaryngology

## 2021-11-20 DIAGNOSIS — R222 Localized swelling, mass and lump, trunk: Secondary | ICD-10-CM

## 2021-11-26 ENCOUNTER — Telehealth: Payer: Self-pay | Admitting: Cardiology

## 2021-11-26 NOTE — Telephone Encounter (Signed)
Dr. Gardiner Rhyme, you saw patient on 11/04/21 after colonoscopy was canceled due to frequent PVCs.  It appears the cardiac monitor does not reveal significant abnormalities.  Is it okay to clear her for colonoscopy?  Please route your response to p cv div preop  Thank you,  Emmaline Life, NP-C    11/26/2021, 12:57 PM [place] [address] Office [phone] Fax [phone]

## 2021-11-26 NOTE — Telephone Encounter (Signed)
   Pre-operative Risk Assessment    Patient Name: Kim Stone  DOB: July 04, 1961 MRN: [id number]      Request for Surgical Clearance    Procedure:   colonscopy  Date of Surgery:  Clearance TBD                                 Surgeon:  Dr. Arta Silence Surgeon's Group or Practice Name:  [facility] Phone number:  [phone] Fax number:  [phone]   Type of Clearance Requested:   - Medical    Type of Anesthesia:   not specified   Additional requests/questions:   n/a  Lonia Chimera   11/26/2021, 8:56 AM

## 2021-11-27 NOTE — Telephone Encounter (Signed)
Yes okay to proceed with colonoscopy

## 2021-11-27 NOTE — Telephone Encounter (Signed)
   Primary Cardiologist: Donato Heinz, MD  Chart reviewed as part of pre-operative protocol coverage. Given past medical history and time since last visit, based on ACC/AHA guidelines, Kim Stone would be at acceptable risk for the planned procedure without further cardiovascular testing.   I will route this recommendation to the requesting party via Epic fax function and remove from pre-op pool.  Please call with questions.  Jossie Ng. Alphonsa Brickle NP-C    11/27/2021, 9:28 AM [place] [address] Office [phone] Fax [phone]

## 2021-12-03 ENCOUNTER — Telehealth: Payer: Self-pay | Admitting: Cardiology

## 2021-12-03 NOTE — Telephone Encounter (Signed)
Patient is requesting to speak with Dr. Newman Nickels nurse regarding concerns she has with her upcoming neck procedure and recent monitor results. She states she just has a few questions and will discuss further when speaking with the nurse.

## 2021-12-03 NOTE — Telephone Encounter (Signed)
Left message to call back  

## 2021-12-04 ENCOUNTER — Encounter: Payer: Self-pay | Admitting: Cardiology

## 2021-12-04 NOTE — Telephone Encounter (Signed)
See my chart message

## 2021-12-10 ENCOUNTER — Ambulatory Visit (INDEPENDENT_AMBULATORY_CARE_PROVIDER_SITE_OTHER): Payer: 59 | Admitting: Physician Assistant

## 2021-12-10 ENCOUNTER — Encounter: Payer: Self-pay | Admitting: Physician Assistant

## 2021-12-10 VITALS — BP 138/92 | HR 59 | Ht 68.0 in | Wt 135.6 lb

## 2021-12-10 DIAGNOSIS — I493 Ventricular premature depolarization: Secondary | ICD-10-CM | POA: Diagnosis not present

## 2021-12-10 DIAGNOSIS — Z01810 Encounter for preprocedural cardiovascular examination: Secondary | ICD-10-CM

## 2021-12-10 DIAGNOSIS — I341 Nonrheumatic mitral (valve) prolapse: Secondary | ICD-10-CM

## 2021-12-10 NOTE — Patient Instructions (Signed)
Medication Instructions:  The current medical regimen is effective;  continue present plan and medications as directed. Please refer to the Current Medication list given to you today.  *If you need a refill on your cardiac medications before your next appointment, please call your pharmacy*  Lab Work:   Testing/Procedures:  NONE    NONE If you have labs (blood work) drawn today and your tests are completely normal, you will receive your results only by: [place] (if you have MyChart) OR  A paper copy in the mail If you have any lab test that is abnormal or we need to change your treatment, we will call you to review the results.   Follow-Up: Your next appointment:  KEEP SCHEDULED  APPOINTMENT  In Person with Donato Heinz, MD    At [facility], you and your health needs are our priority.  As part of our continuing mission to provide you with exceptional heart care, we have created designated Provider Care Teams.  These Care Teams include your primary Cardiologist (physician) and Advanced Practice Providers (APPs -  Physician Assistants and Nurse Practitioners) who all work together to provide you with the care you need, when you need it.    Important Information About Sugar

## 2021-12-10 NOTE — Progress Notes (Signed)
Cardiology Office Note:    Date:  12/12/2021   ID:  Kim Stone, DOB Feb 03, 1961, MRN [id number]  PCP:  Lawerance Cruel, MD   [place] [facility] Cardiologist:  Donato Heinz, MD     Referring MD: Lawerance Cruel, MD   Chief Complaint  Patient presents with   Follow-up    Seen for Dr. Gardiner Rhyme    History of Present Illness:    Kim Stone is a 61 y.o. female with a hx of mild valve prolapse and frequent PVCs.  Patient was previously followed by Dr. Tamala Julian and previously seen in 2017.  Echocardiogram on 10/03/2016 demonstrated moderate posterior leaflet prolapse, trivial MR, EF 55 to 60%, grade 1 DD.  Repeat echocardiogram in August 2022 demonstrated normal biventricular function, normal diastolic function, mild mitral valve posterior leaflet prolapse with trivial MR.  Calcium score 42 in August 2022 which placed the patient at 80th percentile for age and sex matched control.  Patient was scheduled to undergo colonoscopy in May 2023 however was found to have pretty frequent PVCs.  Colonoscopy was canceled.  Basic metabolic panel showed normal renal function, normal electrolyte.  Magnesium normal.  Dr. Gilman Schmidt recommended a 3-day heart monitor.  This showed less than 1% PVCs.  Patient presents today for follow-up.  I have reviewed the recent heart monitor with the patient.  She is fairly active without any exertional chest pain or worsening shortness of breath.  I think she is okay to proceed with colonoscopy procedure as a low risk candidate.  Her overall PVC burden is quite low.  She may continue to use metoprolol as needed to help control occurrence of PVCs.   Past Medical History:  Diagnosis Date   MVP (mitral valve prolapse)    Plantar fasciitis    STD (sexually transmitted disease)    HSV I & II    Past Surgical History:  Procedure Laterality Date   BREAST ENHANCEMENT SURGERY Bilateral 2004   COLONOSCOPY  04/2010   normal recheck in 10 years   TONSILLECTOMY       Current Medications: Current Meds  Medication Sig   cyanocobalamin (,VITAMIN B-12,) 1000 MCG/ML injection 19m   metoprolol (LOPRESSOR) 50 MG tablet Take 50 mg by mouth as needed.   valACYclovir (VALTREX) 1000 MG tablet Take 2 tablets twice daily for one day for oral HSV (Patient taking differently: Take 2 tablets twice daily for one day for oral HSV As Needed)     Allergies:   Patient has no known allergies.   Social History   Socioeconomic History   Marital status: Single    Spouse name: Not on file   Number of children: 1   Years of education: Not on file   Highest education level: Not on file  Occupational History   Not on file  Tobacco Use   Smoking status: Former    Packs/day: 1.00    Years: 20.00    Total pack years: 20.00    Types: Cigarettes    Quit date: 09/13/1995    Years since quitting: 26.2   Smokeless tobacco: Never  Vaping Use   Vaping Use: Never used  Substance and Sexual Activity   Alcohol use: Yes    Comment: a couple a week   Drug use: No   Sexual activity: Not Currently    Partners: Male    Birth control/protection: Post-menopausal  Other Topics Concern   Not on file  Social History Narrative   Not on  file   Social Determinants of Health   Financial Resource Strain: Not on file  Food Insecurity: Not on file  Transportation Needs: Not on file  Physical Activity: Not on file  Stress: Not on file  Social Connections: Not on file     Family History: The patient's family history includes Diabetes in her mother; Heart attack in her maternal grandfather and mother; Hyperlipidemia in her mother; Macular degeneration in her father; Prostate cancer in her father.  ROS:   Please see the history of present illness.     All other systems reviewed and are negative.  EKGs/Labs/Other Studies Reviewed:    The following studies were reviewed today:  Heart monitor 11/18/2021 No significant arrhythmias     Patch Wear Time:  2 days and 20 hours  (2023-05-04T19:08:56-0400 to 2023-05-07T16:06:20-0400)   Patient had a min HR of 49 bpm, max HR of 162 bpm, and avg HR of 75 bpm. Predominant underlying rhythm was Sinus Rhythm. Isolated SVEs were rare (<1.0%), SVE Couplets were rare (<1.0%), and SVE Triplets were rare (<1.0%). Isolated VEs were rare (<1.0%),  VE Couplets were rare (<1.0%), and no VE Triplets were present. Ventricular Bigeminy and Trigeminy were present.  No patient triggered events  EKG:  EKG is not ordered today.    Recent Labs: 11/04/2021: BUN 11; Creatinine, Ser 0.96; Magnesium 2.1; Potassium 4.3; Sodium 144  Recent Lipid Panel    Component Value Date/Time   CHOL 240 (H) 11/18/2016 1554   TRIG 49 11/18/2016 1554   HDL 173 11/18/2016 1554   CHOLHDL 1.4 11/18/2016 1554   VLDL 10 11/18/2016 1554   LDLCALC 57 11/18/2016 1554     Risk Assessment/Calculations:           Physical Exam:    VS:  BP (!) 138/92   Pulse (!) 59   Ht '5\' 8"'$  (1.727 m)   Wt 135 lb 9.6 oz (61.5 kg)   LMP 11/18/2008 (Approximate)   SpO2 98%   BMI 20.62 kg/m     Wt Readings from Last 3 Encounters:  12/10/21 135 lb 9.6 oz (61.5 kg)  11/04/21 141 lb 6.4 oz (64.1 kg)  01/24/21 136 lb 9.6 oz (62 kg)     GEN:  Well nourished, well developed in no acute distress HEENT: Normal NECK: No JVD; No carotid bruits LYMPHATICS: No lymphadenopathy CARDIAC: RRR, no murmurs, rubs, gallops RESPIRATORY:  Clear to auscultation without rales, wheezing or rhonchi  ABDOMEN: Soft, non-tender, non-distended MUSCULOSKELETAL:  No edema; No deformity  SKIN: Warm and dry NEUROLOGIC:  Alert and oriented x 3 PSYCHIATRIC:  Normal affect   ASSESSMENT:    1. PVC's (premature ventricular contractions)   2. Preop cardiovascular exam   3. Mitral valve prolapse    PLAN:    In order of problems listed above:  PVCs: Recent 3-day heart monitor demonstrated less than 1% PVC burden.  She has as needed dose of metoprolol for breakthrough palpitation.  Overall,  given low PVC burden, I would not recommend any further work-up.  She is fairly active without any exertional symptoms  Preoperative clearance: She is a low risk candidate for colonoscopy.  She may proceed without further cardiovascular testing  Mitral valve prolapse: Seen on previous echocardiogram in 2022.  No significant heart murmur on physical exam.            Medication Adjustments/Labs and Tests Ordered: Current medicines are reviewed at length with the patient today.  Concerns regarding medicines are outlined above.  No  orders of the defined types were placed in this encounter.  No orders of the defined types were placed in this encounter.   Patient Instructions  Medication Instructions:  The current medical regimen is effective;  continue present plan and medications as directed. Please refer to the Current Medication list given to you today.  *If you need a refill on your cardiac medications before your next appointment, please call your pharmacy*  Lab Work:   Testing/Procedures:  NONE    NONE If you have labs (blood work) drawn today and your tests are completely normal, you will receive your results only by: [place] (if you have MyChart) OR  A paper copy in the mail If you have any lab test that is abnormal or we need to change your treatment, we will call you to review the results.   Follow-Up: Your next appointment:  KEEP SCHEDULED  APPOINTMENT  In Person with Donato Heinz, MD    At [facility], you and your health needs are our priority.  As part of our continuing mission to provide you with exceptional heart care, we have created designated Provider Care Teams.  These Care Teams include your primary Cardiologist (physician) and Advanced Practice Providers (APPs -  Physician Assistants and Nurse Practitioners) who all work together to provide you with the care you need, when you need it.    Important Information About Sugar              Hilbert Corrigan, [place]  12/12/2021 11:44 PM    [facility]

## 2021-12-12 ENCOUNTER — Encounter: Payer: Self-pay | Admitting: Physician Assistant

## 2021-12-13 ENCOUNTER — Ambulatory Visit
Admission: RE | Admit: 2021-12-13 | Discharge: 2021-12-13 | Disposition: A | Discharge status: Home / Self Care | Payer: 59 | Source: Ambulatory Visit | Attending: Otolaryngology | Admitting: Otolaryngology

## 2021-12-13 ENCOUNTER — Inpatient Hospital Stay: Admission: RE | Admit: 2021-12-13 | Payer: 59 | Source: Ambulatory Visit

## 2021-12-13 DIAGNOSIS — R222 Localized swelling, mass and lump, trunk: Secondary | ICD-10-CM

## 2021-12-13 MED ORDER — IOPAMIDOL (ISOVUE-300) INJECTION 61%
75.0000 mL | Freq: Once | INTRAVENOUS | Status: AC | PRN
  Administered 2021-12-13: 75 mL via INTRAVENOUS

## 2022-02-05 ENCOUNTER — Other Ambulatory Visit: Payer: Self-pay | Admitting: Cardiology

## 2022-02-05 NOTE — Telephone Encounter (Signed)
Rx(s) sent to pharmacy electronically.  

## 2022-05-08 ENCOUNTER — Ambulatory Visit: Payer: 59 | Admitting: Cardiology

## 2022-06-10 ENCOUNTER — Other Ambulatory Visit: Payer: Self-pay | Admitting: Otolaryngology

## 2022-06-10 DIAGNOSIS — R222 Localized swelling, mass and lump, trunk: Secondary | ICD-10-CM

## 2022-06-27 ENCOUNTER — Ambulatory Visit
Admission: RE | Admit: 2022-06-27 | Discharge: 2022-06-27 | Disposition: A | Discharge status: Home / Self Care | Payer: 59 | Source: Ambulatory Visit | Attending: Otolaryngology | Admitting: Otolaryngology

## 2022-06-27 DIAGNOSIS — R222 Localized swelling, mass and lump, trunk: Secondary | ICD-10-CM

## 2022-07-05 IMAGING — CT CT CARDIAC CORONARY ARTERY CALCIUM SCORE
3 series · 14 of 20 positions shown, 15 images · non-contrast
Comparison: None.
COMPARISON: None.

Addendum:
EXAM:
OVER-READ INTERPRETATION  CT CHEST

The following report is an over-read performed by radiologist Dr.
Ahtram Arcilla [REDACTED] on 02/11/2021. This
over-read does not include interpretation of cardiac or coronary
anatomy or pathology. The coronary calcium score interpretation by
the cardiologist is attached.
CLINICAL DATA: Cardiovascular Disease Risk stratification
Coronary Calcium Score
TECHNIQUE: A gated, non-contrast computed tomography scan of the heart was
performed using 3mm slice thickness. Axial images were analyzed on a
dedicated workstation. Calcium scoring of the coronary arteries was
performed using the Agatston method.

[Series 2: casc 3.0 bv41 2 bestdiast 70 % · axial · 0.46mm/px · z∈[-228,-138]mm · 4 of 50 slices shown, 5 images]
[im 10/50  vessel]
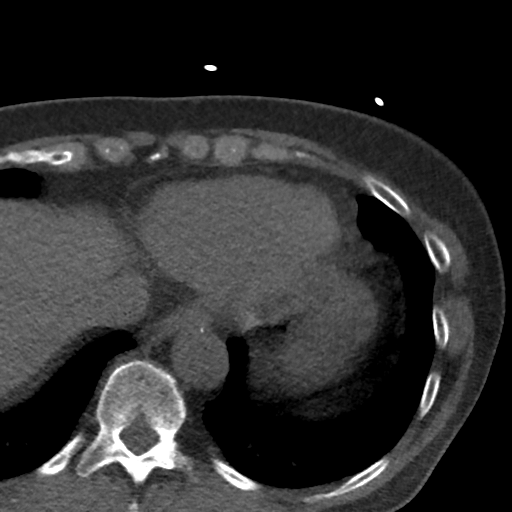
[im 10/50  lung]
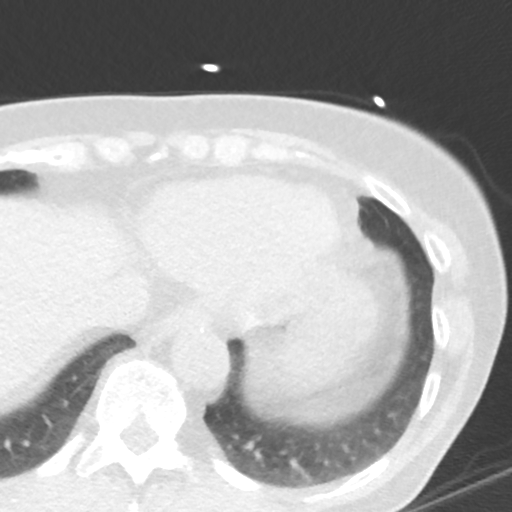
[im 20/50  vessel]
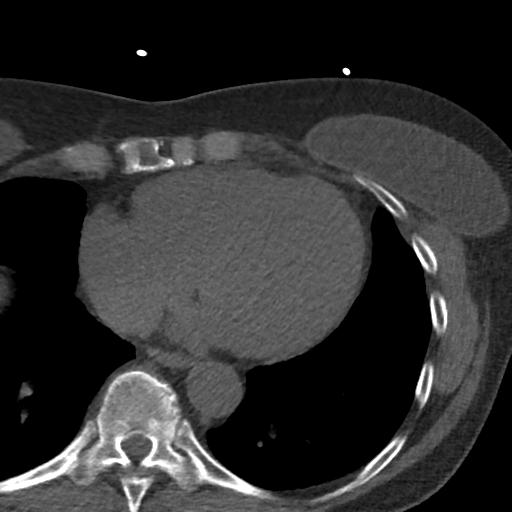
[im 30/50  vessel]
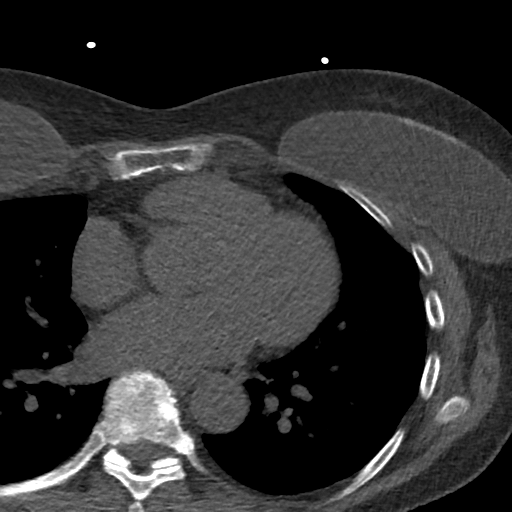
[im 40/50  vessel]
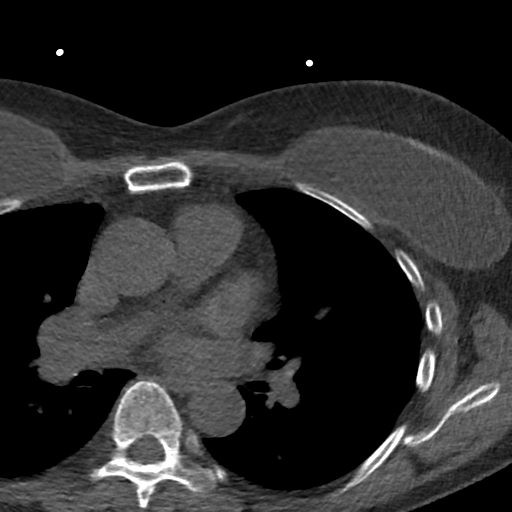

[Series 3: lung 70 % · axial · 0.68mm/px · z∈[-230,-134]mm · 5 of 50 slices shown]
[im 9/50  lung]
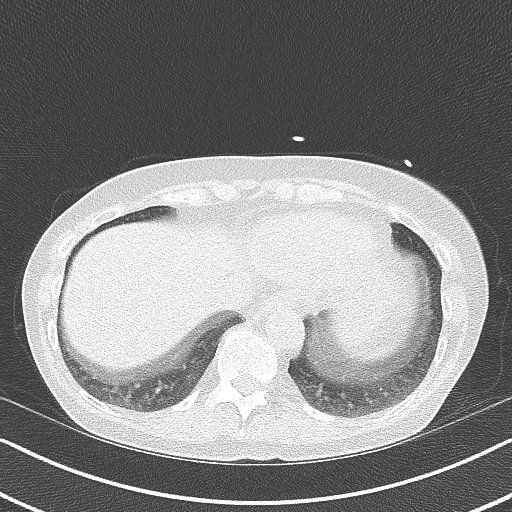
[im 17/50  lung]
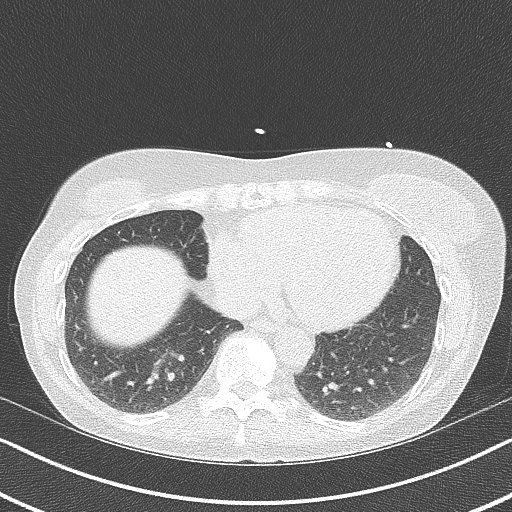
[im 25/50  lung]
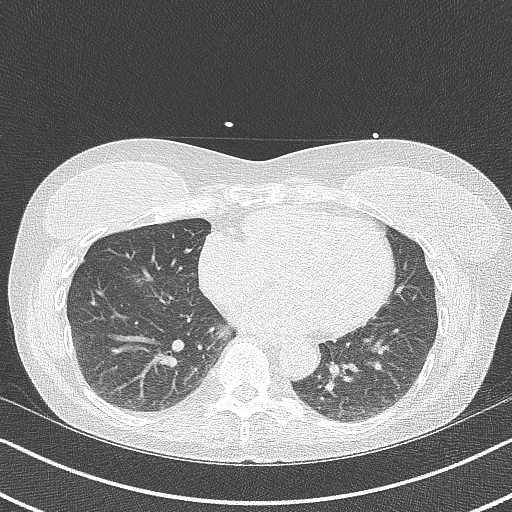
[im 33/50  lung]
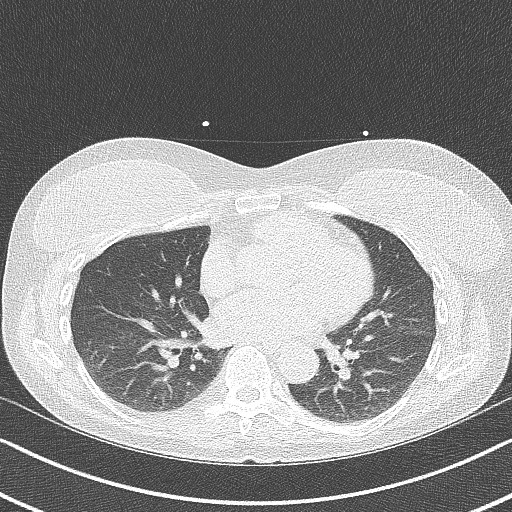
[im 41/50  lung]
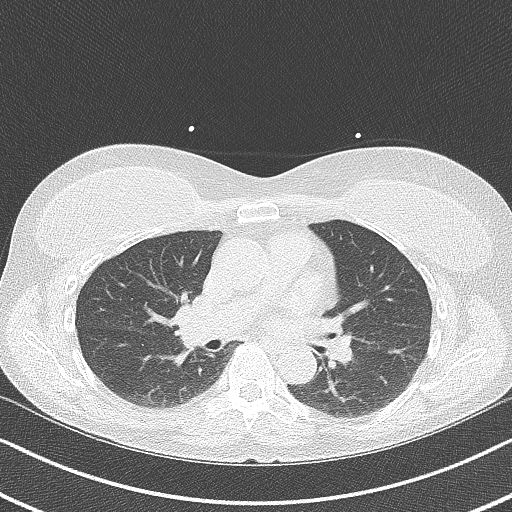

[Series 4: lung st 70 % · axial · 0.68mm/px · z∈[-230,-134]mm · 5 of 50 slices shown]
[im 9/50  lung]
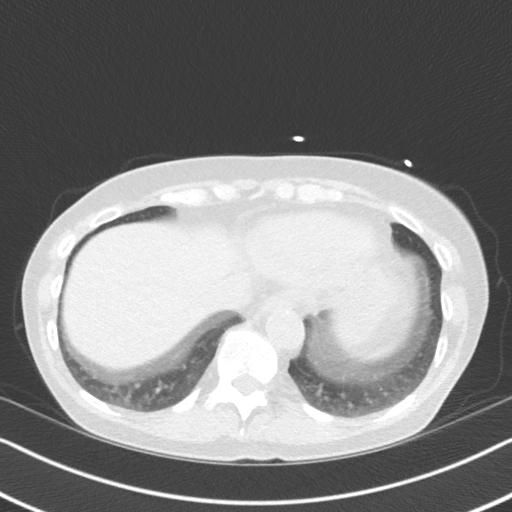
[im 17/50  lung]
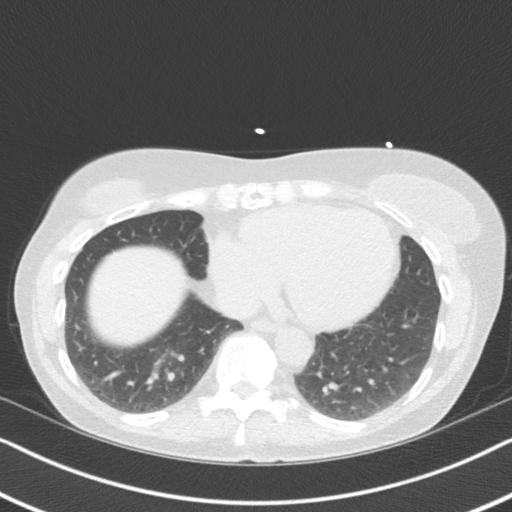
[im 25/50  lung]
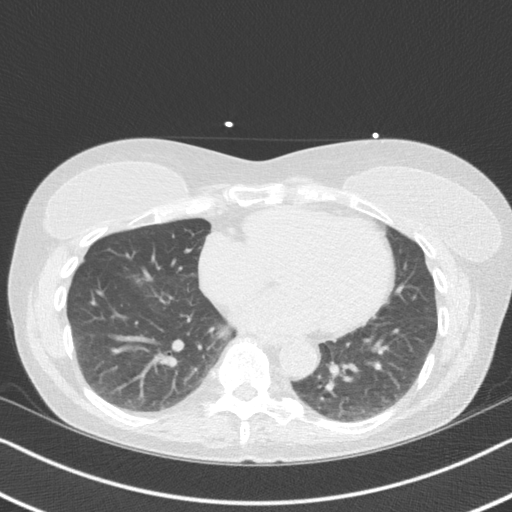
[im 33/50  lung]
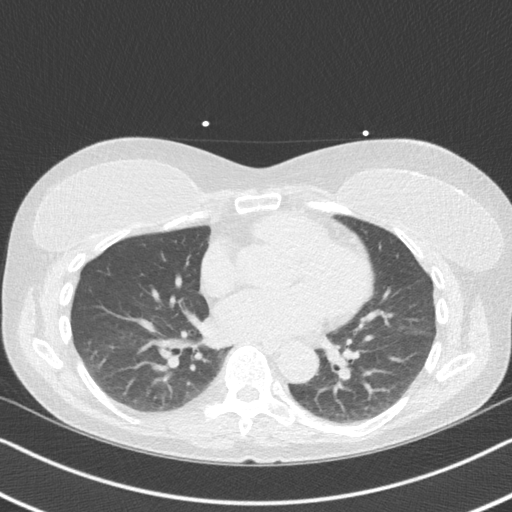
[im 41/50  lung]
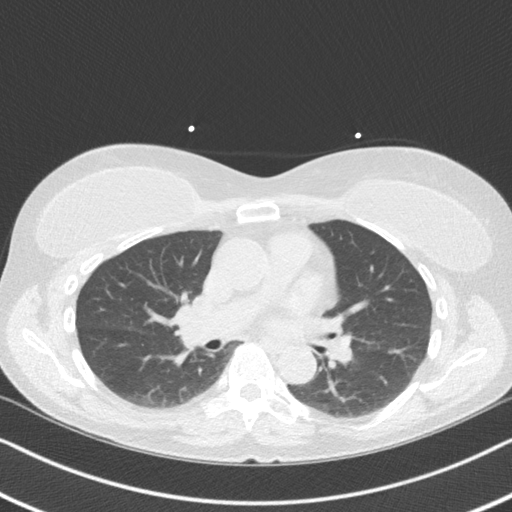

[14 of 20 positions shown; findings below may reference images not displayed]

FINDINGS: Atherosclerotic calcifications in the thoracic aorta. Within the
visualized portions of the thorax there are no suspicious appearing
pulmonary nodules or masses, there is no acute consolidative
airspace disease, no pleural effusions, no pneumothorax and no
lymphadenopathy. Visualized portions of the upper abdomen are
unremarkable. There are no aggressive appearing lytic or blastic
lesions noted in the visualized portions of the skeleton. Bilateral
breast implants are incidentally noted.
IMPRESSION: 1.  Aortic Atherosclerosis ([id number]).
FINDINGS: Coronary Calcium Score:

Left main: 0

Left anterior descending artery:

Left circumflex artery: 0

Right coronary artery: 0

Total:

Percentile: 80th

Pericardium: Normal.

Non-cardiac: See separate report from [REDACTED].
IMPRESSION: Coronary calcium score of 42.2. This was 80th percentile for age-,
race-, and sex-matched controls.



If CAC=0, it is reasonable to withhold statin therapy and reassess
in 5 to 10 years, as long as higher risk conditions are absent
(diabetes mellitus, family history of premature CHD in first degree
relatives (males <55 years; females <65 years), cigarette smoking,
or LDL >=190 mg/dL).

If CAC is 1 to 99, it is reasonable to initiate statin therapy for
patients >=55 years of age.

If CAC is >=100 or >=75th percentile, it is reasonable to initiate
statin therapy at any age.

Cardiology referral should be considered for patients with CAC
scores >=400 or >=75th percentile.

*9464 AHA/ACC/AACVPR/AAPA/ABC/MARTA HENIEK/AGENS/JUMPER/[place]/BOTELLO/BILLIOT/VELCIC
Guideline on the Management of Blood Cholesterol: A Report of the
American College of Cardiology/American Heart Association Task Force
on Clinical Practice Guidelines. J Am Coll Cardiol.
5548;73(24):9311-9265.

*** End of Addendum ***
EXAM:
OVER-READ INTERPRETATION  CT CHEST

The following report is an over-read performed by radiologist Dr.
Ahtram Arcilla [REDACTED] on 02/11/2021. This
over-read does not include interpretation of cardiac or coronary
anatomy or pathology. The coronary calcium score interpretation by
the cardiologist is attached.
FINDINGS: Atherosclerotic calcifications in the thoracic aorta. Within the
visualized portions of the thorax there are no suspicious appearing
pulmonary nodules or masses, there is no acute consolidative
airspace disease, no pleural effusions, no pneumothorax and no
lymphadenopathy. Visualized portions of the upper abdomen are
unremarkable. There are no aggressive appearing lytic or blastic
lesions noted in the visualized portions of the skeleton. Bilateral
breast implants are incidentally noted.
IMPRESSION: 1.  Aortic Atherosclerosis ([id number]-[id number].K).

## 2022-07-15 ENCOUNTER — Ambulatory Visit: Payer: 59 | Admitting: Cardiology

## 2022-07-29 ENCOUNTER — Encounter: Payer: Self-pay | Admitting: Cardiology

## 2022-07-29 ENCOUNTER — Ambulatory Visit: Payer: 59 | Attending: Cardiology | Admitting: Cardiology

## 2022-07-29 VITALS — BP 138/60 | HR 76 | Ht 67.0 in | Wt 140.6 lb

## 2022-07-29 DIAGNOSIS — I493 Ventricular premature depolarization: Secondary | ICD-10-CM

## 2022-07-29 DIAGNOSIS — I2510 Atherosclerotic heart disease of native coronary artery without angina pectoris: Secondary | ICD-10-CM

## 2022-07-29 DIAGNOSIS — E785 Hyperlipidemia, unspecified: Secondary | ICD-10-CM | POA: Diagnosis not present

## 2022-07-29 DIAGNOSIS — I341 Nonrheumatic mitral (valve) prolapse: Secondary | ICD-10-CM | POA: Diagnosis not present

## 2022-07-29 NOTE — Patient Instructions (Signed)
Medication Instructions:  Your physician recommends that you continue on your current medications as directed. Please refer to the Current Medication list given to you today.  *If you need a refill on your cardiac medications before your next appointment, please call your pharmacy*   Lab Work: Please return for FASTING labs (Lipid)  Our in office lab hours are Monday-Friday 8:00-4:00, closed for lunch 12:45-1:45 pm.  No appointment needed.  [facility] locations:   [place] ([place]) - [address] [place] [address] - [address] [place] - [address] ([facility])  Follow-Up: At [facility], you and your health needs are our priority.  As part of our continuing mission to provide you with exceptional heart care, we have created designated Provider Care Teams.  These Care Teams include your primary Cardiologist (physician) and Advanced Practice Providers (APPs -  Physician Assistants and Nurse Practitioners) who all work together to provide you with the care you need, when you need it.  We recommend signing up for the patient portal called "MyChart".  Sign up information is provided on this After Visit Summary.  MyChart is used to connect with patients for Virtual Visits (Telemedicine).  Patients are able to view lab/test results, encounter notes, upcoming appointments, etc.  Non-urgent messages can be sent to your provider as well.   To learn more about what you can do with MyChart, go to [email or web address].    Your next appointment:   12 month(s)  Provider:   Donato Heinz, MD

## 2022-07-29 NOTE — Progress Notes (Signed)
Cardiology Office Note:    Date:  07/29/2022   ID:  FINLEIGH CHEONG, DOB [birth year], MRN [id number]  PCP:  Lawerance Cruel, MD  Cardiologist:  Donato Heinz, MD  Electrophysiologist:  None   Referring MD: Lawerance Cruel, MD   No chief complaint on file.   History of Present Illness:    Kim Stone is a 62 y.o. female with a hx of mitral valve prolapse who presents for follow-up.  She was referred by Dr Harrington Challenger for evaluation of MVP, initially seen on 01/24/2021.  Previously followed with Dr. Tamala Julian, last seen in November 2017.  Echocardiogram 10/03/2016 showed moderate posterior leaflet prolapse, trivial MR, EF 55 to 40%, grade 1 diastolic dysfunction.  Echocardiogram 02/11/2021 showed normal biventricular function, normal diastolic function, mild mitral valve posterior leaflet prolapse with trivial MR.  Calcium score 42 on 02/11/21 (80th percentile).  She presented for colonoscopy 11/2021 study was canceled due to frequent PVCs.  Zio patch x 3 days on 11/18/2021 showed no significant arrhythmias, rare PVCs (less than 1% of beats)  Since last clinic visit, she reports she is doing well.  She underwent colonoscopy which she tolerated well.  She continues to be very active, exercises at least 1 hour/day.  She has been rowing and is training for half marathon.  Denies any chest pain, dyspnea, lightheadedness, syncope, lower extremity edema.  Reports occasional palpitations.   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   valACYclovir (VALTREX) 1000 MG tablet Take 2 tablets twice daily for one day for oral HSV (Patient taking differently: Take 2 tablets twice daily for one day for oral HSV As Needed)     Allergies:   Patient has no known allergies.   Social History   Socioeconomic History   Marital status: Single    Spouse name: Not on file   Number of children: 1   Years of education: Not on file   Highest education level: Not on file  Occupational History   Not on file  Tobacco Use   Smoking status: Former    Packs/day: 1.00    Years: 20.00    Total pack years: 20.00    Types: Cigarettes    Quit date: 09/13/1995    Years since quitting: 26.8   Smokeless tobacco: Never  Vaping Use   Vaping Use: Never used  Substance and Sexual Activity   Alcohol use: Yes    Comment: a couple a week   Drug use: No   Sexual activity: Not Currently    Partners: Male    Birth control/protection: Post-menopausal  Other Topics Concern   Not on file  Social History Narrative   Not on file   Social Determinants of Health   Financial Resource Strain: Not on file  Food Insecurity: Not on file  Transportation Needs: Not on file  Physical Activity: Not on file  Stress: Not on file  Social Connections: Not on file     Family History: The patient's family history includes Diabetes in her mother; Heart attack in her maternal grandfather and mother; Hyperlipidemia in her mother; Macular degeneration in her father; Prostate cancer in her father.  ROS:   Please see the history of present illness.      All other systems reviewed and are negative.  EKGs/Labs/Other Studies Reviewed:    The following studies were reviewed today:  Echo 09/2016:  Study Conclusions   - Left ventricle: The cavity size was normal. Wall thickness was    increased in a pattern of mild LVH. Systolic function was normal.    The estimated ejection fraction was in the range of  55% to 60%.    Wall motion was normal; there were no regional wall motion    abnormalities. Doppler parameters are consistent with abnormal    left ventricular relaxation (grade 1 diastolic dysfunction).  - Mitral valve: Moderate prolapse, involving the posterior leaflet.   EKG:   07/29/22: Sinus rhythm, rate 76, no ST abnormality 11/04/2021: Sinus rhythm, rate 57, no ST abnormalities, no PVCs 07/22: sinus rhythm, sinus arrhythmia, rate 73, no st abnormalities  Recent Labs: 11/04/2021: BUN 11; Creatinine, Ser 0.96; Magnesium 2.1; Potassium 4.3; Sodium 144  Recent Lipid Panel    Component Value Date/Time   CHOL 240 (H) 11/18/2016 1554   TRIG 49 11/18/2016 1554   HDL 173 11/18/2016 1554   CHOLHDL 1.4 11/18/2016 1554   VLDL 10 11/18/2016 1554   LDLCALC 57 11/18/2016 1554    Physical Exam:    VS:  BP 138/60 (BP Location: Left Arm, Patient Position: Sitting, Cuff Size: Normal)   Pulse 76   Ht '5\' 7"'$  (1.702 m)   Wt 140 lb 9.6 oz (63.8 kg)   LMP 11/18/2008 (Approximate)   SpO2 99%   BMI 22.02 kg/m     Wt Readings from Last 3 Encounters:  07/29/22 140 lb 9.6 oz (63.8 kg)  12/10/21 135 lb 9.6 oz (61.5 kg)  11/04/21 141 lb 6.4 oz (64.1 kg)     GEN:  Well nourished, well developed in no acute distress HEENT: Normal NECK: No JVD; No carotid bruits CARDIAC: RRR, no murmurs, rubs, gallops RESPIRATORY:  Clear to auscultation without rales, wheezing or rhonchi  ABDOMEN: Soft, non-tender, non-distended MUSCULOSKELETAL:  No edema; No deformity  SKIN: Warm and dry NEUROLOGIC:  Alert and oriented x 3 PSYCHIATRIC:  Normal affect   ASSESSMENT:    1. PVC's (premature ventricular contractions)   2. Coronary artery disease involving native coronary artery of native heart without angina pectoris   3. Hyperlipidemia, unspecified hyperlipidemia type   4. Mitral valve prolapse      PLAN:    PVCs: Noted frequent PVCs on rhythm strip at presentation for colonoscopy 11/2021.  Suspect may be  due to to electrolyte abnormality from her prep.  Zio patch x 3 days on 11/18/2021 showed no significant arrhythmias, rare PVCs (less than 1% of beats).  Echocardiogram 02/11/2021 showed normal biventricular function, normal diastolic function, mild mitral valve posterior leaflet prolapse with trivial MR.    CAD: Calcium score 42 on 02/11/21 (80th percentile). -Continue atorvastatin 20 mg daily.  LDL 61 on 08/05/2021.  Check lipid panel  Mitral valve prolapse: Echocardiogram 10/03/2016 showed moderate posterior leaflet prolapse, trivial MR.  Echocardiogram 02/11/2021 showed normal biventricular function, normal diastolic function, mild mitral valve posterior leaflet prolapse with trivial MR.    Hyperlipidemia: Continue atorvastatin  20 mg daily.  LDL 61 on 08/05/2021.  Check lipid panel   RTC in 1 year   Medication Adjustments/Labs and Tests Ordered: Current medicines are reviewed at length with the patient today.  Concerns regarding medicines are outlined above.  Orders Placed This Encounter  Procedures   Lipid panel   EKG 12-Lead   No orders of the defined types were placed in this encounter.    Patient Instructions  Medication Instructions:  Your physician recommends that you continue on your current medications as directed. Please refer to the Current Medication list given to you today.  *If you need a refill on your cardiac medications before your next appointment, please call your pharmacy*   Lab Work: Please return for FASTING labs (Lipid)  Our in office lab hours are Monday-Friday 8:00-4:00, closed for lunch 12:45-1:45 pm.  No appointment needed.  [facility] locations:   [place] ([place]) - [address] [place] [address] [place] - [address] ([facility])  Follow-Up: At [facility], you and your health needs are our priority.  As part of our continuing mission to provide you with exceptional heart care, we have created designated Provider Care Teams.  These Care Teams include your primary Cardiologist (physician) and Advanced Practice Providers (APPs -  Physician Assistants and Nurse Practitioners) who all work together to provide you with the care you need, when you need it.  We recommend signing up for the patient portal called "MyChart".  Sign up information is provided on this After Visit Summary.  MyChart is used to connect with patients for Virtual Visits (Telemedicine).  Patients are able to view lab/test results, encounter notes, upcoming appointments, etc.  Non-urgent messages can be sent to your provider as well.   To learn more about what you can do with MyChart, go to [email or web address].    Your next appointment:   12 month(s)  Provider:   Donato Heinz, MD            Signed, Donato Heinz, MD  07/29/2022 5:33 PM    [place]

## 2022-08-19 LAB — LIPID PANEL
Chol/HDL Ratio: 1.5 ratio (ref 0.0–4.4)
Cholesterol, Total: 197 mg/dL (ref 100–199)
HDL: 129 mg/dL (ref >39)
LDL Chol Calc (NIH): 55 mg/dL (ref 0–99)
Triglycerides: 73 mg/dL (ref 0–149)
VLDL Cholesterol Cal: 13 mg/dL (ref 5–40)

## 2022-11-03 ENCOUNTER — Other Ambulatory Visit: Payer: Self-pay | Admitting: Cardiology

## 2023-07-30 NOTE — Progress Notes (Signed)
Cardiology Office Note:    Date:  07/31/2023   ID:  Kim Stone, DOB 11/29/1960, MRN [id number]  PCP:  Kim Floro, MD  Cardiologist:  Little Ishikawa, MD  Electrophysiologist:  None   Referring MD: Kim Floro, MD   Chief Complaint  Patient presents with   Coronary Artery Disease    History of Present Illness:    Kim Stone is a 63 y.o. female with a hx of mitral valve prolapse who presents for follow-up.  She was referred by Dr Kim Stone for evaluation of MVP, initially seen on 01/24/2021.  Previously followed with Dr. Katrinka Stone, last seen in November 2017.  Echocardiogram 10/03/2016 showed moderate posterior leaflet prolapse, trivial MR, EF 55 to 60%, grade 1 diastolic dysfunction.  Echocardiogram 02/11/2021 showed normal biventricular function, normal diastolic function, mild mitral valve posterior leaflet prolapse with trivial MR.  Calcium score 42 on 02/11/21 (80th percentile).  She presented for colonoscopy 11/2021 study was canceled due to frequent PVCs.  Zio patch x 3 days on 11/18/2021 showed no significant arrhythmias, rare PVCs (less than 1% of beats)  Since last clinic visit, she reports she is doing well.  Denies any chest pain, dyspnea,  lower extremity edema, or palpitations.  Occasional lightheadedness but no syncope.  Continues to exercise regularly, does biking and rowing machine.  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    valACYclovir (VALTREX) 1000 MG tablet Take 2 tablets twice daily for one day for oral HSV (Patient taking differently: Take 2 tablets twice daily for one day for oral HSV As Needed)     Allergies:   Patient has no known allergies.   Social History   Socioeconomic History   Marital status: Single    Spouse name: Not on file   Number of children: 1   Years of education: Not on file   Highest education level: Not on file  Occupational History   Not on file  Tobacco Use   Smoking status: Former    Current packs/day: 0.00    Average packs/day: 1 pack/day for 20.0 years (20.0 ttl pk-yrs)    Types: Cigarettes    Start date: 09/13/1975    Quit date: 09/13/1995    Years since quitting: 27.8   Smokeless tobacco: Never  Vaping Use   Vaping status: Never Used  Substance and Sexual Activity   Alcohol use: Yes    Comment: a couple a week   Drug use: No   Sexual activity: Not Currently    Partners: Male    Birth control/protection: Post-menopausal  Other Topics Concern   Not on file  Social History Narrative   Not on file   Social Drivers of Health   Financial Resource Strain: Not on file  Food Insecurity: Not on file  Transportation Needs: Not on file  Physical Activity: Not on file  Stress: Not on  file  Social Connections: Not on file     Family History: The patient's family history includes Diabetes in her mother; Heart attack in her maternal grandfather and mother; Hyperlipidemia in her mother; Macular degeneration in her father; Prostate cancer in her father.  ROS:   Please see the history of present illness.      All other systems reviewed and are negative.  EKGs/Labs/Other Studies Reviewed:    The following studies were reviewed today:  Echo 09/2016:  Study Conclusions   - Left ventricle: The cavity size was normal. Wall thickness was    increased in a pattern of mild LVH. Systolic function was normal.    The estimated ejection fraction was in the range of 55% to  60%.    Wall motion was normal; there were no regional wall motion    abnormalities. Doppler parameters are consistent with abnormal    left ventricular relaxation (grade 1 diastolic dysfunction).  - Mitral valve: Moderate prolapse, involving the posterior leaflet.   EKG:   07/31/2023: Normal sinus rhythm, rate 61, no ST abnormalities 07/29/22: Sinus rhythm, rate 76, no ST abnormality 11/04/2021: Sinus rhythm, rate 57, no ST abnormalities, no PVCs 07/22: sinus rhythm, sinus arrhythmia, rate 73, no st abnormalities  Recent Labs: No results found for requested labs within last 365 days.  Recent Lipid Panel    Component Value Date/Time   CHOL 197 08/19/2022 0943   TRIG 73 08/19/2022 0943   HDL 129 08/19/2022 0943   CHOLHDL 1.5 08/19/2022 0943   CHOLHDL 1.4 11/18/2016 1554   VLDL 10 11/18/2016 1554   LDLCALC 55 08/19/2022 0943    Physical Exam:    VS:  BP 134/72 (BP Location: Left Arm, Patient Position: Sitting)   Pulse 61   Ht 5\' 7"  (1.702 m)   Wt 133 lb 9.6 oz (60.6 kg)   LMP 11/18/2008 (Approximate)   SpO2 98%   BMI 20.92 kg/m     Wt Readings from Last 3 Encounters:  07/31/23 133 lb 9.6 oz (60.6 kg)  07/29/22 140 lb 9.6 oz (63.8 kg)  12/10/21 135 lb 9.6 oz (61.5 kg)     GEN:  Well nourished, well developed in no acute distress HEENT: Normal NECK: No JVD; No carotid bruits CARDIAC: RRR, no murmurs, rubs, gallops RESPIRATORY:  Clear to auscultation without rales, wheezing or rhonchi  ABDOMEN: Soft, non-tender, non-distended MUSCULOSKELETAL:  No edema; No deformity  SKIN: Warm and dry NEUROLOGIC:  Alert and oriented x 3 PSYCHIATRIC:  Normal affect   ASSESSMENT:    1. Coronary artery disease involving native coronary artery of native heart without angina pectoris   2. PVC's (premature ventricular contractions)   3. Hyperlipidemia, unspecified hyperlipidemia type       PLAN:    PVCs: Noted frequent PVCs on rhythm strip at presentation for colonoscopy 11/2021.   Suspect may be due to to electrolyte abnormality from her prep.  Zio patch x 3 days on 11/18/2021 showed no significant arrhythmias, rare PVCs (less than 1% of beats).  Echocardiogram 02/11/2021 showed normal biventricular function, normal diastolic function, mild mitral valve posterior leaflet prolapse with trivial MR.    CAD: Calcium score 42 on 02/11/21 (80th percentile). -Continue atorvastatin 20 mg daily.  LDL 61 on 08/05/2021.  Check lipid panel and LP(a) level  Mitral valve prolapse: Echocardiogram 10/03/2016 showed moderate posterior leaflet prolapse, trivial MR.  Echocardiogram 02/11/2021 showed normal biventricular function, normal diastolic function, mild mitral valve posterior leaflet prolapse with trivial MR.  Hyperlipidemia: Continue atorvastatin 20 mg daily.  LDL 55 on 08/19/2022.  Check lipid panel and LP(a) level  RTC in 1 year   Medication Adjustments/Labs and Tests Ordered: Current medicines are reviewed at length with the patient today.  Concerns regarding medicines are outlined above.  Orders Placed This Encounter  Procedures   Lipid panel   Lipoprotein A (LPA)   EKG 12-Lead   No orders of the defined types were placed in this encounter.    Patient Instructions  Medication Instructions:  Continue current medication *If you need a refill on your cardiac medications before your next appointment, please call your pharmacy*   Lab Work: Lipid panel, Lipoprotein A today If you have labs (blood work) drawn today and your tests are completely normal, you will receive your results only by: MyChart Message (if you have MyChart) OR A paper copy in the mail If you have any lab test that is abnormal or we need to change your treatment, we will call you to review the results.   Testing/Procedures: none   Follow-Up: At [facility], you and your health needs are our priority.  As part of our continuing mission to provide you with exceptional heart care, we have  created designated Provider Care Teams.  These Care Teams include your primary Cardiologist (physician) and Advanced Practice Providers (APPs -  Physician Assistants and Nurse Practitioners) who all work together to provide you with the care you need, when you need it.  We recommend signing up for the patient portal called "MyChart".  Sign up information is provided on this After Visit Summary.  MyChart is used to connect with patients for Virtual Visits (Telemedicine).  Patients are able to view lab/test results, encounter notes, upcoming appointments, etc.  Non-urgent messages can be sent to your provider as well.   To learn more about what you can do with MyChart, go to [email or web address].    Your next appointment:   1 year(s)  Provider:   Little Ishikawa, MD     Other Instructions none            Signed, Little Ishikawa, MD  07/31/2023 10:09 AM    [facility]

## 2023-07-31 ENCOUNTER — Ambulatory Visit: Payer: 59 | Attending: Cardiology | Admitting: Cardiology

## 2023-07-31 VITALS — BP 134/72 | HR 61 | Ht 67.0 in | Wt 133.6 lb

## 2023-07-31 DIAGNOSIS — I2510 Atherosclerotic heart disease of native coronary artery without angina pectoris: Secondary | ICD-10-CM

## 2023-07-31 DIAGNOSIS — I493 Ventricular premature depolarization: Secondary | ICD-10-CM | POA: Diagnosis not present

## 2023-07-31 DIAGNOSIS — E785 Hyperlipidemia, unspecified: Secondary | ICD-10-CM | POA: Diagnosis not present

## 2023-07-31 NOTE — Patient Instructions (Signed)
Medication Instructions:  Continue current medication *If you need a refill on your cardiac medications before your next appointment, please call your pharmacy*   Lab Work: Lipid panel, Lipoprotein A today If you have labs (blood work) drawn today and your tests are completely normal, you will receive your results only by: MyChart Message (if you have MyChart) OR A paper copy in the mail If you have any lab test that is abnormal or we need to change your treatment, we will call you to review the results.   Testing/Procedures: none   Follow-Up: At [facility], you and your health needs are our priority.  As part of our continuing mission to provide you with exceptional heart care, we have created designated Provider Care Teams.  These Care Teams include your primary Cardiologist (physician) and Advanced Practice Providers (APPs -  Physician Assistants and Nurse Practitioners) who all work together to provide you with the care you need, when you need it.  We recommend signing up for the patient portal called "MyChart".  Sign up information is provided on this After Visit Summary.  MyChart is used to connect with patients for Virtual Visits (Telemedicine).  Patients are able to view lab/test results, encounter notes, upcoming appointments, etc.  Non-urgent messages can be sent to your provider as well.   To learn more about what you can do with MyChart, go to [email or web address].    Your next appointment:   1 year(s)  Provider:   Little Ishikawa, MD     Other Instructions none

## 2023-08-02 LAB — LIPID PANEL
Chol/HDL Ratio: 1.4 {ratio} (ref 0.0–4.4)
Cholesterol, Total: 175 mg/dL (ref 100–199)
HDL: 123 mg/dL (ref >39)
LDL Chol Calc (NIH): 41 mg/dL (ref 0–99)
Triglycerides: 53 mg/dL (ref 0–149)
VLDL Cholesterol Cal: 11 mg/dL (ref 5–40)

## 2023-08-02 LAB — LIPOPROTEIN A (LPA): Lipoprotein (a): 38.7 nmol/L (ref <75.0)

## 2023-08-04 ENCOUNTER — Encounter: Payer: Self-pay | Admitting: Cardiology

## 2023-08-05 ENCOUNTER — Other Ambulatory Visit: Payer: Self-pay | Admitting: Cardiology
